# Patient Record
Sex: Male | Born: 1985
Health system: Southern US, Community
[De-identification: ages and names within clinical notes are randomized; demographics above are authoritative.]

## PROBLEM LIST (undated history)

## (undated) DIAGNOSIS — F419 Anxiety disorder, unspecified: Secondary | ICD-10-CM

## (undated) DIAGNOSIS — E139 Other specified diabetes mellitus without complications: Secondary | ICD-10-CM

## (undated) DIAGNOSIS — I10 Essential (primary) hypertension: Secondary | ICD-10-CM

## (undated) DIAGNOSIS — E669 Obesity, unspecified: Secondary | ICD-10-CM

## (undated) DIAGNOSIS — F329 Major depressive disorder, single episode, unspecified: Secondary | ICD-10-CM

## (undated) DIAGNOSIS — F32A Depression, unspecified: Secondary | ICD-10-CM

## (undated) HISTORY — PX: NO PAST SURGERIES: SHX2092

## (undated) HISTORY — DX: Anxiety disorder, unspecified: F41.9

## (undated) HISTORY — DX: Depression, unspecified: F32.A

---
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
## 2017-08-24 DIAGNOSIS — H5213 Myopia, bilateral: Secondary | ICD-10-CM | POA: Diagnosis not present

## 2017-11-25 DIAGNOSIS — Z131 Encounter for screening for diabetes mellitus: Secondary | ICD-10-CM | POA: Diagnosis not present

## 2017-11-25 DIAGNOSIS — Z6841 Body Mass Index (BMI) 40.0 and over, adult: Secondary | ICD-10-CM | POA: Diagnosis not present

## 2017-11-25 DIAGNOSIS — Z113 Encounter for screening for infections with a predominantly sexual mode of transmission: Secondary | ICD-10-CM | POA: Diagnosis not present

## 2017-11-25 DIAGNOSIS — L80 Vitiligo: Secondary | ICD-10-CM | POA: Diagnosis not present

## 2017-11-25 DIAGNOSIS — Z1331 Encounter for screening for depression: Secondary | ICD-10-CM | POA: Diagnosis not present

## 2017-11-25 DIAGNOSIS — Z0000 Encounter for general adult medical examination without abnormal findings: Secondary | ICD-10-CM | POA: Diagnosis not present

## 2018-04-29 DIAGNOSIS — F331 Major depressive disorder, recurrent, moderate: Secondary | ICD-10-CM | POA: Diagnosis not present

## 2018-05-12 DIAGNOSIS — F331 Major depressive disorder, recurrent, moderate: Secondary | ICD-10-CM | POA: Diagnosis not present

## 2018-05-26 DIAGNOSIS — F331 Major depressive disorder, recurrent, moderate: Secondary | ICD-10-CM | POA: Diagnosis not present

## 2018-06-10 DIAGNOSIS — F331 Major depressive disorder, recurrent, moderate: Secondary | ICD-10-CM | POA: Diagnosis not present

## 2018-06-14 ENCOUNTER — Telehealth (HOSPITAL_COMMUNITY): Payer: Self-pay | Admitting: Psychiatry

## 2018-06-14 ENCOUNTER — Encounter (HOSPITAL_COMMUNITY): Payer: Self-pay | Admitting: Psychiatry

## 2018-06-14 ENCOUNTER — Other Ambulatory Visit: Payer: Self-pay

## 2018-06-14 ENCOUNTER — Ambulatory Visit (INDEPENDENT_AMBULATORY_CARE_PROVIDER_SITE_OTHER): Payer: 59 | Admitting: Psychiatry

## 2018-06-14 DIAGNOSIS — F331 Major depressive disorder, recurrent, moderate: Secondary | ICD-10-CM | POA: Diagnosis not present

## 2018-06-14 DIAGNOSIS — F419 Anxiety disorder, unspecified: Secondary | ICD-10-CM

## 2018-06-14 MED ORDER — ESCITALOPRAM OXALATE 10 MG PO TABS: ORAL_TABLET | ORAL | 0 refills | Status: DC

## 2018-06-14 MED ORDER — HYDROXYZINE PAMOATE 25 MG PO CAPS: ORAL_CAPSULE | ORAL | 0 refills | Status: DC

## 2018-06-14 MED FILL — HYDROXYZINE PAMOATE 25 MG C: 25 | 15 days supply | Qty: 30 | Fill #0

## 2018-06-14 MED FILL — ESCITALOPRAM 10 MG TABLET: 10 | 30 days supply | Qty: 30 | Fill #0

## 2018-06-14 NOTE — Progress Notes (Signed)
Virtual Visit via Video Note  I connected with Jose Burgess on 06/14/18 at  1:00 PM EDT by a video enabled telemedicine application and verified that I am speaking with the correct person using two identifiers.   I discussed the limitations of evaluation and management by telemedicine and the availability of in person appointments. The patient expressed understanding and agreed to proceed.  [facility] Initial Assessment Note  DART RAULERSON [id number] 33 y.o.  06/14/2018 1:59 PM  Chief Complaint:  I have a lot of anxiety and depression.  History of Present Illness:  Jose Burgess is a 33 year old African-American, single, employed man who is referred from his counselor for medication management.  Patient struggle with anxiety and depression for the past few months.  He had a break-up 2 months ago after 2 years of relationship.  Patient admitted that he cheated with a coworker and his girlfriend find out by going through phone messages.  Even though he has no physical contact with the person that he cheated with but he is seeing her at his work.  He admitted feeling very guilty and regret about his act.  Patient told that he has been having a lot of anxiety, nervousness, racing thoughts, feeling down and sometimes having suicidal thoughts.  Though he denies any attempt but admitted last week he had a plan to purchase the weapon.  But he was able to distract himself and move on.  He does not have any weapon.  He started therapy few weeks ago but he does not feel it is helping as much.  Patient reported feeling isolated, withdrawn, lack of energy, having crying spells with lack of appetite.  He admitted his mood is irritable and ups and down and he worried about future.  Though he believes things are slowly getting better in his relationship but is not back to the normal.  He was moved out as before he was living together but now he moved in back but they are living in a separate room.  Patient  told that in the night this girl keeps coming into his sleep and he is not able to distract himself.  Patient reported anhedonia, feeling of worthlessness, excessive guilt, lack of energy and fatigue.  He also feel a lot of anxiety around people.  He reported that he had a panic attack while at work.  Patient also reported history of cheating in the past but did not provide details.  Patient denies any mania, psychosis, hallucination, aggression, violence or any nightmares.  He admitted increased drinking since the incident but denies any blackouts or intoxication.  His last drink was beer few days ago.  He denies any illegal substance use.  He works in the [employer] at [employer].  Patient told the girl also worked in the same department and he cannot avoid her.  He is thinking to switch his job location but he has not done so far.  Patient told that he enjoys playing video game and doing grill.  Patient currently not taking any psychotropic medication.   Suicidal Ideation: No Plan Formed: Last week thinking about purchasing of weapon. Patient has means to carry out plan: No  Homicidal Ideation: No Plan Formed: No Patient has means to carry out plan: No  Past Psychiatric History/Hospitalization(s): History of anxiety and depression in his early life.  History of being bullied in the school.  No history of taking any psychotropic medication.  No history of psychiatric inpatient treatment, suicidal attempt,  psychosis, paranoia or aggressive behavior.  Family History; Reported mother has anxiety.  Father has mental disorder but no detail.  Medical History; Patient has vitiligo and he is overweight.  Traumatic brain injury: No history of traumatic brain injury and seizures.  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  Psychosocial History; Patient born and raised in [place].  His father deceased  when he was only 33 year old.  Patient mother and 2 sisters lives in [place].  Legal History; Denies any legal issues.  History Of Abuse; History of bullied in the school.  Substance Abuse History; History of drinking recently more than usual but denies any withdrawals, intoxication or blackouts.  Review of Systems: Psychiatric: Agitation: No Hallucination: No Depressed Mood: Yes Insomnia: Yes Hypersomnia: No Altered Concentration: No Feels Worthless: Yes Grandiose Ideas: No Belief In Special Powers: No New/Increased Substance Abuse: Yes Compulsions: No  Neurologic: Headache: No Seizure: No Paresthesias: No   Outpatient Encounter Medications as of 06/14/2018  Medication Sig  . SILDENAFIL CITRATE PO CHEW 1 TO 2 TABLETS BY MOUTH 30 TO 45 MINUTES BEFORE SEXUAL INTERCOURSE   No facility-administered encounter medications on file as of 06/14/2018.     No results found for this or any previous visit (from the past 2160 hour(s)).    Constitutional:  There were no vitals taken for this visit.   Musculoskeletal: Strength & Muscle Tone: within normal limits Gait & Station: n/a Patient leans: N/A  Psychiatric Specialty Exam: Physical Exam  ROS  There were no vitals taken for this visit.There is no height or weight on file to calculate BMI.  General Appearance: Casual and have beared  Eye Contact:  wearing dark shades  Speech:  Slow  Volume:  Decreased  Mood:  Anxious and Depressed  Affect:  Congruent and Constricted  Thought Process:  Goal Directed  Orientation:  Full (Time, Place, and Person)  Thought Content:  Rumination  Suicidal Thoughts:  Yes.  without intent/plan  Homicidal Thoughts:  No  Memory:  Immediate;   Good Recent;   Good Remote;   Good  Judgement:  Good  Insight:  Good  Psychomotor Activity:  Normal  Concentration:  Concentration: Fair and Attention Span: Fair  Recall:  Good  Fund of Knowledge:  Good  Language:  Good  Akathisia:  No   Handed:  Right  AIMS (if indicated):     Assets:  Communication Skills Desire for Improvement Housing Physical Health Resilience Talents/Skills Transportation  ADL's:  Intact  Cognition:  WNL  Sleep:   poor      Assessment and plan: Jonny RuizJohn is a 33 year old African-American man presented with a symptoms of anxiety, depression.  He has never prescribed any psychotropic medication.  Currently he is seeing a therapist.  Discussed treatment option.  He like to try something to help his depression anxiety and sleep.  Recommend to try Lexapro 10 mg to help his depression anxiety.  Recommend to take half tablet for few days to tolerate better.  Explained that medicine can cause nausea, headaches and EPS.  I also recommend that he should try hydroxyzine 25 mg 1 to 2 capsule to help his anxiety and insomnia.  I also recommend intensive outpatient program.  Patient agreed with the plan.  I spoke to Jeri Modenaita Clark program coordinator and patient is going to visit with Jeri Modenaita Clark to get orientation about the program.  Discussed safety concern that anytime having active suicidal thoughts  or homicidal thought the need to call 911 or go to local emergency room.  Follow-up once he finished the program.  I recommend to call us back if he has any question or any concern.     Follow Up Instructions:    I discussed the assessment and treatment plan with the patient. The patient was provided an opportunity to ask questions and all were answered. The patient agreed with the plan and demonstrated an understanding of the instructions.   The patient was advised to call back or seek an in-person evaluation if the symptoms worsen or if the condition fails to improve as anticipated.  I provided 55 minutes of non-face-to-face time during this encounter.   Cleotis Nipper, MD

## 2018-06-14 NOTE — Telephone Encounter (Signed)
D:  Dr. Lolly Mustache referred pt to MH-IOP.  A:  Pt arrived for writer to orient him and provide him with a start date.  According to pt, he works from 9-4:30 pm everyday, but has a day off during the week and thought he could just attend group on that day.  Reiterated to pt that the groups are Monday thru Friday from 9-12n for two weeks.  Encouraged pt to check with HR and management to see if he would be able to attend MH-IOP.  Scheduled pt with Forde Radon, LPC on 06/22/18 @ 8 a.m; just in case if he's unable to do group.  Provided pt with writer's phone number.  Inform Dr. Lolly Mustache.  R:  Pt receptive.

## 2018-06-15 ENCOUNTER — Other Ambulatory Visit (HOSPITAL_COMMUNITY): Payer: 59

## 2018-06-16 ENCOUNTER — Other Ambulatory Visit (HOSPITAL_COMMUNITY): Payer: 59

## 2018-06-17 ENCOUNTER — Other Ambulatory Visit (HOSPITAL_COMMUNITY): Payer: 59

## 2018-06-21 ENCOUNTER — Other Ambulatory Visit (HOSPITAL_COMMUNITY): Payer: 59

## 2018-06-22 ENCOUNTER — Ambulatory Visit (INDEPENDENT_AMBULATORY_CARE_PROVIDER_SITE_OTHER): Payer: 59 | Admitting: Psychology

## 2018-06-22 ENCOUNTER — Other Ambulatory Visit: Payer: Self-pay

## 2018-06-22 ENCOUNTER — Other Ambulatory Visit (HOSPITAL_COMMUNITY): Payer: 59

## 2018-06-22 ENCOUNTER — Encounter (HOSPITAL_COMMUNITY): Payer: Self-pay | Admitting: Psychology

## 2018-06-22 DIAGNOSIS — F331 Major depressive disorder, recurrent, moderate: Secondary | ICD-10-CM

## 2018-06-22 DIAGNOSIS — F419 Anxiety disorder, unspecified: Secondary | ICD-10-CM

## 2018-06-22 NOTE — Progress Notes (Signed)
Virtual Visit via Video Note  I connected with Jose Burgess on 06/22/18 at  8:00 AM EDT by a video enabled telemedicine application and verified that I am speaking with the correct person using two identifiers.   I discussed the limitations of evaluation and management by telemedicine and the availability of in person appointments. The patient expressed understanding and agreed to proceed.  I provided 40 minutes of non-face-to-face time during this encounter.   Forde Radon [facility]  Comprehensive Clinical Assessment (CCA) Note  06/22/2018 Jose Burgess [id number]  Visit Diagnosis:      ICD-10-CM   1. MDD (major depressive disorder), recurrent episode, moderate (HCC) F33.1   2. Anxiety F41.9       CCA Part One  Part One has been completed on paper by the patient.  (See scanned document in Chart Review)  CCA Part Two A  Intake/Chief Complaint:  CCA Intake With Chief Complaint CCA Part Two Date: 06/22/18 CCA Part Two Time: 0812 Chief Complaint/Presenting Problem: pt is referred for counseling services by Dr. Lolly Mustache.  Pt has been dealing w/ depressive and anxiety symptoms that triggered by relationship problems that have occurred in the past couple of months.  pt reported that he started cheating January 2020 and this came to light in April to fiancee.  pt reported that they have been living together for 1.5 years and together for 2 years. pt reported that they are currently separated but he is living in the same house w/ her again.  pt reported that they are working on the relationship w/ the intention of getting back together and reports that things are heading in the right direction.  pt cheated w/ a coworker and so still sees when work the same shift.  pt reports this has made more difficult to move on and forward and still has difficulty getting the infidelity out of his head.  pt reports that this is the main stressor.  pt reports he had about 4 counseling sesisons w/ 'emily'  until discovered out of network.  pt reports that in 2008 he was dx w/ depression and anxiety but didn't take any meds at the time.   Patients Currently Reported Symptoms/Problems: pt reports that he is always worrying about something and general sense of unease.  pt reports that feels depressed moods some days- struggles w/ being very negative w/ self and putting self down daily.  pt reports he has been sleeping more and feeling very fatigued.  pt reports loss of concentration.  pt reports sometimes vague fleeting thoughts that tired of living like this, that life is going to get better.  Pt no intent for suicide or self harm.  pt no plans for self harm/ suicide and not hx.   Collateral Involvement: dr. Sheela Stack note Individual's Strengths: pt reports supports as sister and Danielle/girlfriend.  Pt enjoys cooking- grilling, smoking meats and watching tv and playing games. Individual's Preferences: "I want to be able to learn to deal w/ my anxiey and depression and to be happy" Type of Services Patient Feels Are Needed: counseling and medication management  Mental Health Symptoms Depression:  Depression: Fatigue, Change in energy/activity, Difficulty Concentrating, Hopelessness, Sleep (too much or little), Worthlessness, Increase/decrease in appetite  Mania:  Mania: N/A  Anxiety:   Anxiety: Difficulty concentrating, Fatigue, Sleep, Worrying  Psychosis:  Psychosis: N/A  Trauma:  Trauma: N/A  Obsessions:  Obsessions: N/A  Compulsions:  Compulsions: N/A  Inattention:  Inattention: N/A  Hyperactivity/Impulsivity:  Hyperactivity/Impulsivity: N/A  Oppositional/Defiant  Behaviors:  Oppositional/Defiant Behaviors: N/A  Borderline Personality:  Emotional Irregularity: N/A  Other Mood/Personality Symptoms:      Mental Status Exam Appearance and self-care  Stature:  Stature: Average  Weight:  Weight: Average weight  Clothing:     Grooming:  Grooming: Well-groomed  Cosmetic use:  Cosmetic Use: None   Posture/gait:  Posture/Gait: Normal  Motor activity:  Motor Activity: Not Remarkable  Sensorium  Attention:  Attention: Normal  Concentration:  Concentration: Normal  Orientation:  Orientation: X5  Recall/memory:  Recall/Memory: Normal  Affect and Mood  Affect:  Affect: Appropriate  Mood:  Mood: Depressed, Anxious  Relating  Eye contact:  Eye Contact: Normal  Facial expression:  Facial Expression: Responsive  Attitude toward examiner:  Attitude Toward Examiner: Cooperative  Thought and Language  Speech flow: Speech Flow: Normal  Thought content:  Thought Content: Appropriate to mood and circumstances  Preoccupation:     Hallucinations:     Organization:     Company secretaryxecutive Functions  Fund of Knowledge:  Fund of Knowledge: Average  Intelligence:  Intelligence: Average  Abstraction:  Abstraction: Normal  Judgement:  Judgement: Normal  Reality Testing:  Reality Testing: Adequate  Insight:  Insight: Good  Decision Making:  Decision Making: Normal  Social Functioning  Social Maturity:  Social Maturity: Responsible  Social Judgement:  Social Judgement: Normal  Stress  Stressors:  Stressors: Transitions(relationship)  Coping Ability:  Coping Ability: Building surveyorverwhelmed  Skill Deficits:     Supports:      Family and Psychosocial History: Family history Marital status: Single(pt in relationship for 2 years. living together- recently separated w/ intent to work out relationship) Are you sexually active?: Yes What is your sexual orientation?: straight Does patient have children?: No  Childhood History:  Childhood History By whom was/is the patient raised?: Both parents Description of patient's relationship with caregiver when they were a child: pt reports good family and close Patient's description of current relationship with people who raised him/her: pt reports close to family.  dad deceased when he was in 11th grade Does patient have siblings?: Yes Number of Siblings: 2 Description  of patient's current relationship with siblings: 2 older sisters who were grown and out of house when he was growing up.  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?: No Are There Guns or Other Weapons in Your Home?: No  Education: Education Last Grade Completed: 12 Did Garment/textile technologistYou Graduate From [employer]?: Yes Did Theme park managerYou Attend College?: (complete food service training program) Did You Have An Individualized Education Program (IIEP): Yes Did You Have Any Difficulty At School?: Yes(pt was bullied and teased.  pt had a learning disability and received pull out services) Were Any Medications Ever Prescribed For These Difficulties?: No  Religion:  Leisure/Recreation: Leisure / Recreation Leisure and Hobbies: smoking meats, grilling- watching tv and gaming  Exercise/Diet: Exercise/Diet Do You Exercise?: No Have You Gained or Lost A Significant Amount of Weight in the Past Six Months?: No Do You Follow a Special Diet?: No Do You Have Any Trouble Sleeping?: Yes Explanation of Sleeping Difficulties: feeling tired a lot and sleeping more  CCA Part Two C  Alcohol/Drug  Use: Alcohol / Drug Use History of alcohol / drug use?: No history of alcohol / drug abuse                      CCA Part Three  ASAM's:  Six Dimensions of Multidimensional Assessment  Dimension 1:  Acute Intoxication and/or Withdrawal Potential:     Dimension 2:  Biomedical Conditions and Complications:     Dimension 3:  Emotional, Behavioral, or Cognitive Conditions and Complications:     Dimension 4:  Readiness to Change:     Dimension 5:  Relapse, Continued use, or Continued Problem Potential:     Dimension 6:  Recovery/Living Environment:      Substance use Disorder (SUD)    Social Function:  Social Functioning Social Maturity: Responsible Social Judgement: Normal  Stress:  Stress Stressors: Transitions(relationship) Coping Ability: Overwhelmed Patient Takes Medications The Way The Doctor Instructed?: Yes Priority Risk: Low Acuity  Risk Assessment- Self-Harm Potential: Risk Assessment For Self-Harm Potential Thoughts of Self-Harm: No current thoughts Method: No plan Availability of Means: No access/NA  Risk Assessment -Dangerous to Others Potential: Risk Assessment For Dangerous to Others Potential Method: No Plan Availability of Means: No access or NA  DSM5 Diagnoses: There are no active problems to display for this patient.   Patient Centered Plan: Patient is on the following Treatment Plan(s):  Anxiety and Depression  Recommendations for Services/Supports/Treatments: Recommendations for Services/Supports/Treatments Recommendations For Services/Supports/Treatments: Individual Therapy, Medication Management  Treatment Plan Summary: OP Treatment Plan Summary: Pt to attend weekly counseling to assist coping w/ anxiety and depression.  Continue w/ Dr. Lolly Mustache for medication management.   I discussed the assessment and treatment plan with the patient. The patient was provided an opportunity to ask questions and all were answered. The patient agreed with  the plan and demonstrated an understanding of the instructions.   The patient was advised to call back or seek an in-person evaluation if the symptoms worsen or if the condition fails to improve as anticipated.  Forde Radon

## 2018-06-23 ENCOUNTER — Other Ambulatory Visit (HOSPITAL_COMMUNITY): Payer: 59

## 2018-06-24 ENCOUNTER — Other Ambulatory Visit (HOSPITAL_COMMUNITY): Payer: 59

## 2018-06-27 ENCOUNTER — Other Ambulatory Visit (HOSPITAL_COMMUNITY): Payer: 59

## 2018-06-28 ENCOUNTER — Other Ambulatory Visit (HOSPITAL_COMMUNITY): Payer: 59

## 2018-06-29 ENCOUNTER — Encounter (HOSPITAL_COMMUNITY): Payer: Self-pay | Admitting: Psychology

## 2018-06-29 ENCOUNTER — Other Ambulatory Visit: Payer: Self-pay

## 2018-06-29 ENCOUNTER — Other Ambulatory Visit (HOSPITAL_COMMUNITY): Payer: 59

## 2018-06-29 ENCOUNTER — Ambulatory Visit (HOSPITAL_COMMUNITY): Payer: 59 | Admitting: Psychology

## 2018-06-29 NOTE — Progress Notes (Signed)
Jose Burgess is a 33 y.o. male patient who called just prior to appointment and informed he couldn't make to appointment today.  Pt is scheduled for f/u.         Forde Radon, [facility]

## 2018-06-30 ENCOUNTER — Other Ambulatory Visit (HOSPITAL_COMMUNITY): Payer: 59

## 2018-07-01 ENCOUNTER — Other Ambulatory Visit (HOSPITAL_COMMUNITY): Payer: 59

## 2018-07-04 ENCOUNTER — Other Ambulatory Visit (HOSPITAL_COMMUNITY): Payer: 59

## 2018-07-05 ENCOUNTER — Other Ambulatory Visit (HOSPITAL_COMMUNITY): Payer: 59

## 2018-07-06 ENCOUNTER — Other Ambulatory Visit: Payer: Self-pay

## 2018-07-06 ENCOUNTER — Ambulatory Visit (INDEPENDENT_AMBULATORY_CARE_PROVIDER_SITE_OTHER): Payer: 59 | Admitting: Psychology

## 2018-07-06 DIAGNOSIS — F419 Anxiety disorder, unspecified: Secondary | ICD-10-CM | POA: Diagnosis not present

## 2018-07-06 DIAGNOSIS — F331 Major depressive disorder, recurrent, moderate: Secondary | ICD-10-CM

## 2018-07-06 NOTE — Progress Notes (Signed)
Virtual Visit via Video Note  I connected with Jose Burgess on 07/06/18 at  9:00 AM EDT by a video enabled telemedicine application and verified that I am speaking with the correct person using two identifiers.   I discussed the limitations of evaluation and management by telemedicine and the availability of in person appointments. The patient expressed understanding and agreed to proceed.    I discussed the assessment and treatment plan with the patient. The patient was provided an opportunity to ask questions and all were answered. The patient agreed with the plan and demonstrated an understanding of the instructions.   The patient was advised to call back or seek an in-person evaluation if the symptoms worsen or if the condition fails to improve as anticipated.  I provided 40 minutes of non-face-to-face time during this encounter.   Jan Fireman [facility]    THERAPIST PROGRESS NOTE  Session Time: 9.01-9.41am  Participation Level: Active  Behavioral Response: Well GroomedAlertaffect wnl  Type of Therapy: Individual Therapy  Treatment Goals addressed: Diagnosis: MDD, anxiety and goal 1.  Interventions: CBT, Supportive and Other: boundaries  Summary: Jose Burgess is a 33 y.o. male who presents with affect wnl.  Pt is ready for work, in his car just prior to going in. Pt reported that a lot has happened in the past 2 weeks and overall feels that is positive even though stressors and changes.  Pt reported that he and his ex had discussed that he needed to move out- that she was still really hurt and not sure could continue trying to work things out.  Pt reported hard but understood and aware that relationship wasn't going to be able to continue in way he wanted.  Pt reported that as not together he decided to go out w/ a new "friend"- but w/ romantic interests.  His ex found out as tracked his phone and showed up.  This was in the last week- and he moved out to stay w/ sister after  this.  Pt reported he understood that she was upset.  Pt reported that he is aware that he is looking for someone else to fill his void and doesn't want to be dependent on someone to feel happy.  Pt is aware that he needs to slowly develop the relationship then and still focus on self and enjoying things independently.  Pt discussed what this will look like then.  Pt reported he has apartment complex that lived in past that will f/u w/ for finding on place. His has returned to living in [place], [place] and will continue to work in [place].   Suicidal/Homicidal: Nowithout intent/plan  Therapist Response: Assessed pt current functioning per pt report. Processed w/pt change in relaitonship and concerns he has for being dependent on relationship for happiness. Discussed w/pt focus on his self care outside of relationship and how he can concretely do this.  Discussed transitions w/ the move.   Plan: Return again in 2 weeks, via webex.   Diagnosis: MDD, GAD  Jan Fireman [facility] 07/06/2018

## 2018-07-25 ENCOUNTER — Other Ambulatory Visit: Payer: Self-pay

## 2018-07-25 ENCOUNTER — Ambulatory Visit (HOSPITAL_COMMUNITY): Payer: 59 | Admitting: Psychology

## 2018-07-26 ENCOUNTER — Encounter (HOSPITAL_COMMUNITY): Payer: Self-pay | Admitting: Psychiatry

## 2018-07-26 ENCOUNTER — Ambulatory Visit (INDEPENDENT_AMBULATORY_CARE_PROVIDER_SITE_OTHER): Payer: 59 | Admitting: Psychiatry

## 2018-07-26 ENCOUNTER — Other Ambulatory Visit: Payer: Self-pay

## 2018-07-26 DIAGNOSIS — F419 Anxiety disorder, unspecified: Secondary | ICD-10-CM

## 2018-07-26 DIAGNOSIS — F331 Major depressive disorder, recurrent, moderate: Secondary | ICD-10-CM | POA: Diagnosis not present

## 2018-07-26 MED ORDER — ESCITALOPRAM OXALATE 10 MG PO TABS: ORAL_TABLET | ORAL | 1 refills | Status: DC

## 2018-07-26 MED ORDER — HYDROXYZINE HCL 10 MG PO TABS: ORAL_TABLET | ORAL | 1 refills | Status: DC

## 2018-07-26 MED FILL — ESCITALOPRAM 10 MG TABLET: 10 | 30 days supply | Qty: 30 | Fill #0

## 2018-07-26 MED FILL — hydrOXYzine HCL 10 MG TABS: 10 | 30 days supply | Qty: 60 | Fill #0

## 2018-07-26 NOTE — Progress Notes (Signed)
Virtual Visit via Telephone Note  I connected with Jose Burgess on 07/26/18 at  3:40 PM EDT by telephone and verified that I am speaking with the correct person using two identifiers.   I discussed the limitations, risks, security and privacy concerns of performing an evaluation and management service by telephone and the availability of in person appointments. I also discussed with the patient that there may be a patient responsible charge related to this service. The patient expressed understanding and agreed to proceed.   History of Present Illness: Patient was evaluated by phone session.  He is a 33 year old African-American man who was seen 4 weeks ago due to severe anxiety, depression.  He had a break-up after 2 years of relationship.  He had cheated with a coworker who also works in his department.  Patient works at [employer] at [employer].  He was feeling very guilty and regret about his act.  We started him on Lexapro and hydroxyzine and recommended IOP and individual therapy however due to his work schedule he could not do IOP.  He started therapy with Forde RadonLeanne Yates and doing well.  He also taking his medication which is helping his anxiety and depression.  He has no longer suicidal thoughts.  His energy level is good.  He is more hopeful and denies any crying spells or any feeling of worthlessness.  He has taken few times hydroxyzine to help his anxiety but he reported it is making him very groggy.  He like Lexapro since it is helping his depression and anxiety.  He endorses that he is still see the girl who works in his department but we tried to avoid eye contact and learn other ways to keep himself out of the communication.  Denies any panic attack.  Denies any hallucination, paranoia.  He admitted since taking the medication he is cut down his drinking and occasionally had a beer.  He is currently living with his sister in [place] until he find his own place.  Denies any illegal  substance use.  He wants to continue Lexapro and therapy with Forde RadonLeanne Yates.  He denies any agitation, anger or any mood swing.  He is wondering if he can try less dose of the hydroxyzine since 25 mg capsule is making him groggy.  His appetite is okay.  His energy level is good.  Past Psychiatric History: History of anxiety and depression in his early life.  History of being bullied in the school.  No history of taking any psychotropic medication.  No history of psychiatric inpatient treatment, suicidal attempt, psychosis, paranoia or aggressive behavior.    Psychiatric Specialty Exam: Physical Exam  ROS  There were no vitals taken for this visit.There is no height or weight on file to calculate BMI.  General Appearance: NA  Eye Contact:  NA  Speech:  Clear and Coherent and Normal Rate  Volume:  Normal  Mood:  Dysphoric  Affect:  NA  Thought Process:  Goal Directed  Orientation:  Full (Time, Place, and Person)  Thought Content:  Rumination  Suicidal Thoughts:  No  Homicidal Thoughts:  No  Memory:  Immediate;   Good Recent;   Good Remote;   Good  Judgement:  Good  Insight:  Good  Psychomotor Activity:  NA  Concentration:  Concentration: Fair and Attention Span: Fair  Recall:  Good  Fund of Knowledge:  Good  Language:  Good  Akathisia:  No  Handed:  Right  AIMS (if indicated):  Assets:  Communication Skills Desire for Improvement Housing Resilience Social Support  ADL's:  Intact  Cognition:  WNL  Sleep:   fair      Assessment and Plan: Major depressive disorder, recurrent.  Anxiety.  Patient doing better on Lexapro 10 mg.  He is tolerating very well and reported no tremors shakes or any EPS.  I will discontinue hydroxyzine capsule as patient is feeling groggy whenever he takes it.  Recommend to take hydroxyzine 10 mg tablet to take 1 to 2 tablet as needed if needed.  Encouraged to continue therapy with Jan Fireman.  Discussed medication side effects and benefits.   Recommended to call us back if is any question or any concern.  Follow-up in 2 months.  Follow Up Instructions:    I discussed the assessment and treatment plan with the patient. The patient was provided an opportunity to ask questions and all were answered. The patient agreed with the plan and demonstrated an understanding of the instructions.   The patient was advised to call back or seek an in-person evaluation if the symptoms worsen or if the condition fails to improve as anticipated.  I provided 30 minutes of non-face-to-face time during this encounter.   Kathlee Nations, MD

## 2018-09-13 MED FILL — ESCITALOPRAM 10 MG TABLET: 10 | 30 days supply | Qty: 30 | Fill #1

## 2018-09-13 MED FILL — hydrOXYzine HCL 10 MG TABS: 10 | 30 days supply | Qty: 60 | Fill #1

## 2018-09-26 ENCOUNTER — Encounter (HOSPITAL_COMMUNITY): Payer: Self-pay | Admitting: Psychiatry

## 2018-09-26 ENCOUNTER — Ambulatory Visit (INDEPENDENT_AMBULATORY_CARE_PROVIDER_SITE_OTHER): Payer: 59 | Admitting: Psychiatry

## 2018-09-26 ENCOUNTER — Other Ambulatory Visit: Payer: Self-pay

## 2018-09-26 DIAGNOSIS — F331 Major depressive disorder, recurrent, moderate: Secondary | ICD-10-CM

## 2018-09-26 DIAGNOSIS — F419 Anxiety disorder, unspecified: Secondary | ICD-10-CM | POA: Diagnosis not present

## 2018-09-26 MED ORDER — HYDROXYZINE HCL 10 MG PO TABS: ORAL_TABLET | ORAL | 1 refills | Status: DC

## 2018-09-26 MED ORDER — ESCITALOPRAM OXALATE 10 MG PO TABS: ORAL_TABLET | ORAL | 2 refills | Status: DC

## 2018-09-26 NOTE — Progress Notes (Signed)
Virtual Visit via Telephone Note  I connected with Jose Burgess on 09/26/18 at  4:00 PM EDT by telephone and verified that I am speaking with the correct person using two identifiers.   I discussed the limitations, risks, security and privacy concerns of performing an evaluation and management service by telephone and the availability of in person appointments. I also discussed with the patient that there may be a patient responsible charge related to this service. The patient expressed understanding and agreed to proceed.   History of Present Illness: Patient is evaluated by phone session.  On his last visit we cut down his hydroxyzine and he is only taking 10 mg tablet.  Patient told it is working very well.  He is sleeping good and he denies any feeling of grogginess.  His job is also going well.  He is still see the coworker who works in his department but there has been no recent contact or any communication.  Patient now moved into his own place in [place] and he has a new number.  He does not feel he need to see Jose Burgess since things are going well.  He denies any crying spells or any feeling of hopelessness or worthlessness.  His appetite is okay.  He denies any irritability, nervousness or any panic attack.  He denies drinking or using any illegal substances.  He has no tremors shakes or any EPS.  He wants to continue Lexapro and hydroxyzine as needed.  His appetite is okay.  His weight is a stable.   Past Psychiatric History: History of anxiety and depression in his early life. History of being bullied in the school. No history of taking any psychotropic medication. No history of psychiatric inpatient treatment, suicidal attempt, psychosis, paranoia or aggressive behavior.    Psychiatric Specialty Exam: Physical Exam  ROS  There were no vitals taken for this visit.There is no height or weight on file to calculate BMI.  General Appearance: NA  Eye Contact:  NA  Speech:   Clear and Coherent and Slow  Volume:  Normal  Mood:  Euthymic  Affect:  NA  Thought Process:  Goal Directed  Orientation:  Full (Time, Place, and Person)  Thought Content:  WDL and Logical  Suicidal Thoughts:  No  Homicidal Thoughts:  No  Memory:  Immediate;   Good Recent;   Good Remote;   Good  Judgement:  Good  Insight:  Good  Psychomotor Activity:  NA  Concentration:  Concentration: Good and Attention Span: Good  Recall:  Good  Fund of Knowledge:  Good  Language:  Good  Akathisia:  No  Handed:  Right  AIMS (if indicated):     Assets:  Communication Skills Desire for Improvement Housing Resilience Social Support Talents/Skills Transportation  ADL's:  Intact  Cognition:  WNL  Sleep:   improved      Assessment and Plan: Major depressive disorder, recurrent.  Anxiety.  Patient is doing much better on Lexapro and low-dose hydroxyzine.  He does not feel he need to see a therapist.  Discussed medication side effects and benefits.  Recommended to call us back if he has any question or any concern.  Follow-up in 3 months.  Follow Up Instructions:    I discussed the assessment and treatment plan with the patient. The patient was provided an opportunity to ask questions and all were answered. The patient agreed with the plan and demonstrated an understanding of the instructions.   The patient was advised  to call back or seek an in-person evaluation if the symptoms worsen or if the condition fails to improve as anticipated.  I provided 15 minutes of non-face-to-face time during this encounter.   Kathlee Nations, MD

## 2018-10-20 ENCOUNTER — Encounter (HOSPITAL_COMMUNITY): Payer: Self-pay | Admitting: Psychology

## 2018-10-20 NOTE — Progress Notes (Signed)
Jose Burgess is a 33 y.o. male patient who is discharged from counseling as no longer seeking counseling at this time.  Outpatient Therapist Discharge Summary  [name]    [birth year]   Admission Date: 06/22/18    Discharge Date:  10/20/18 Reason for Discharge:  Not seeking counseling  Diagnosis:  MDD, Anxiety  Comments:  Pt no show for appt in June 2020, didn't schedule further f/u.  Per psychiatrist note on 09/26/18 he is doing well and not seeking further counseling at this time.  F/u as scheduled w/ Dr. Adele Schilder.  [name], [facility]

## 2018-12-20 ENCOUNTER — Encounter (HOSPITAL_COMMUNITY): Payer: Self-pay | Admitting: Psychiatry

## 2018-12-20 ENCOUNTER — Other Ambulatory Visit: Payer: Self-pay

## 2018-12-20 ENCOUNTER — Ambulatory Visit (INDEPENDENT_AMBULATORY_CARE_PROVIDER_SITE_OTHER): Payer: 59 | Admitting: Psychiatry

## 2018-12-20 DIAGNOSIS — F331 Major depressive disorder, recurrent, moderate: Secondary | ICD-10-CM | POA: Diagnosis not present

## 2018-12-20 DIAGNOSIS — F419 Anxiety disorder, unspecified: Secondary | ICD-10-CM | POA: Diagnosis not present

## 2018-12-20 MED ORDER — ESCITALOPRAM OXALATE 10 MG PO TABS: ORAL_TABLET | ORAL | 2 refills | Status: DC

## 2018-12-20 MED ORDER — HYDROXYZINE HCL 10 MG PO TABS: ORAL_TABLET | ORAL | 1 refills | Status: DC

## 2018-12-20 NOTE — Progress Notes (Signed)
Virtual Visit via Telephone Note  I connected with Desiree Lucy Mealing on 12/20/18 at  4:20 PM EST by telephone and verified that I am speaking with the correct person using two identifiers.   I discussed the limitations, risks, security and privacy concerns of performing an evaluation and management service by telephone and the availability of in person appointments. I also discussed with the patient that there may be a patient responsible charge related to this service. The patient expressed understanding and agreed to proceed.   History of Present Illness: Patient was evaluated by phone session.  He is no longer taking Lexapro because he felt it was causing GI side effects.  He stopped 2 weeks ago but notices depression is getting worse.  He is sleeping good with hydroxyzine.  His job is also going well and he has not seen his coworker and his department which was the issue before.  He has no tremors, shakes or any EPS.  He feels his anxiety is not as bad and he does not need to see therapist.  He denies any crying spells or any feeling of hopelessness or worthlessness.  Energy level is okay.  His appetite is okay and his weight is unchanged from the past.  He weighs 290 pounds.  Patient works in [employer].  Patient is now moved into his own place and is happy about it.   Past Psychiatric History: History of anxiety and depression in his early life. History of being bullied in the school. No history of taking any psychotropic medication. No history of psychiatric inpatient treatment, suicidal attempt, psychosis, paranoia or aggressive behavior.    Psychiatric Specialty Exam: Physical Exam  ROS  There were no vitals taken for this visit.There is no height or weight on file to calculate BMI.  General Appearance: NA  Eye Contact:  NA  Speech:  Clear and Coherent and Normal Rate  Volume:  Normal  Mood:  Anxious and Dysphoric  Affect:  NA  Thought Process:  Goal Directed   Orientation:  Full (Time, Place, and Person)  Thought Content:  Logical  Suicidal Thoughts:  No  Homicidal Thoughts:  No  Memory:  Immediate;   Good Recent;   Good Remote;   Good  Judgement:  Fair  Insight:  Good  Psychomotor Activity:  NA  Concentration:  Concentration: Good and Attention Span: Good  Recall:  Good  Fund of Knowledge:  Good  Language:  Good  Akathisia:  No  Handed:  Right  AIMS (if indicated):     Assets:  Communication Skills Desire for Improvement Housing Resilience Talents/Skills Transportation  ADL's:  Intact  Cognition:  WNL  Sleep:   ok      Assessment and Plan: Major depressive disorder, recurrent.  Anxiety.  I recommend that he should try Lexapro taking at bedtime since it was helping his depression and since he stopped he noticed his depression is coming back.  He agreed with the plan.  Continue low-dose hydroxyzine to help his insomnia and anxiety.  Continue present dose 10 mg hydroxyzine.  Recommended to call us back if is any question or any concern.  Follow-up in 3 months.  Follow Up Instructions:    I discussed the assessment and treatment plan with the patient. The patient was provided an opportunity to ask questions and all were answered. The patient agreed with the plan and demonstrated an understanding of the instructions.   The patient was advised to call back or seek an  in-person evaluation if the symptoms worsen or if the condition fails to improve as anticipated.  I provided 20 minutes of non-face-to-face time during this encounter.   Kathlee Nations, MD

## 2018-12-26 ENCOUNTER — Ambulatory Visit (HOSPITAL_COMMUNITY): Payer: 59 | Admitting: Psychiatry

## 2019-03-21 ENCOUNTER — Ambulatory Visit (INDEPENDENT_AMBULATORY_CARE_PROVIDER_SITE_OTHER): Payer: 59 | Admitting: Psychiatry

## 2019-03-21 ENCOUNTER — Encounter (HOSPITAL_COMMUNITY): Payer: Self-pay | Admitting: Psychiatry

## 2019-03-21 ENCOUNTER — Other Ambulatory Visit: Payer: Self-pay

## 2019-03-21 DIAGNOSIS — F419 Anxiety disorder, unspecified: Secondary | ICD-10-CM

## 2019-03-21 DIAGNOSIS — F331 Major depressive disorder, recurrent, moderate: Secondary | ICD-10-CM

## 2019-03-21 MED ORDER — HYDROXYZINE HCL 10 MG PO TABS: ORAL_TABLET | ORAL | 1 refills | Status: DC

## 2019-03-21 MED ORDER — ESCITALOPRAM OXALATE 10 MG PO TABS: ORAL_TABLET | ORAL | 2 refills | Status: DC

## 2019-03-21 MED FILL — hydrOXYzine HCL 10 MG TABS: 10 | 30 days supply | Qty: 30 | Fill #0

## 2019-03-21 MED FILL — ESCITALOPRAM 10 MG TABLET: 10 | 30 days supply | Qty: 30 | Fill #0

## 2019-03-21 NOTE — Progress Notes (Signed)
Virtual Visit via Telephone Note  I connected with Humberto Leep Flatt on 03/21/19 at  4:20 PM EST by telephone and verified that I am speaking with the correct person using two identifiers.   I discussed the limitations, risks, security and privacy concerns of performing an evaluation and management service by telephone and the availability of in person appointments. I also discussed with the patient that there may be a patient responsible charge related to this service. The patient expressed understanding and agreed to proceed.   History of Present Illness: Patient was evaluated by phone session.  He is taking Lexapro and hydroxyzine.  Since he back on Lexapro his anxiety depression is much better.  He is sleeping good.  His job is also going well and he has no more issues with the coworker and has not seen the coworker since the last visit.  He had a good holidays.  Denies any crying spells or any hopelessness.  His energy level is good.  His appetite is okay.  He works in [employer].  He denies drinking or using any illegal substances.  He has no tremors, shakes or any EPS.   Past Psychiatric History: H/O anxiety and depression. H/O being bullied in the school. No h/o prior meds, inpatient treatment, suicidal attempt, psychosis, paranoia or aggressive behavior.    Psychiatric Specialty Exam: Physical Exam  Review of Systems  There were no vitals taken for this visit.There is no height or weight on file to calculate BMI.  General Appearance: NA  Eye Contact:  NA  Speech:  Clear and Coherent and Normal Rate  Volume:  Normal  Mood:  Euthymic  Affect:  NA  Thought Process:  Goal Directed  Orientation:  Full (Time, Place, and Person)  Thought Content:  WDL and Logical  Suicidal Thoughts:  No  Homicidal Thoughts:  No  Memory:  Immediate;   Good Recent;   Good Remote;   Good  Judgement:  Intact  Insight:  Present  Psychomotor Activity:  NA  Concentration:  Concentration: Good  and Attention Span: Good  Recall:  Good  Fund of Knowledge:  Good  Language:  Good  Akathisia:  No  Handed:  Right  AIMS (if indicated):     Assets:  Communication Skills Desire for Improvement Housing Resilience Social Support Talents/Skills Transportation  ADL's:  Intact  Cognition:  WNL  Sleep:   ok      Assessment and Plan: Major depressive disorder, recurrent.  Anxiety.  Patient doing very well since he is back on Lexapro.  He does not want to change medication since he feels both medicine is working very well.  Continue Lexapro 10 mg daily and hydroxyzine 10 mg as needed for anxiety.  Recommended to call us back if is any question or any concern.  Follow-up in 3 months.  Follow Up Instructions:    I discussed the assessment and treatment plan with the patient. The patient was provided an opportunity to ask questions and all were answered. The patient agreed with the plan and demonstrated an understanding of the instructions.   The patient was advised to call back or seek an in-person evaluation if the symptoms worsen or if the condition fails to improve as anticipated.  I provided 15 minutes of non-face-to-face time during this encounter.   Cleotis Nipper, MD

## 2019-05-12 ENCOUNTER — Ambulatory Visit: Payer: Self-pay | Attending: Internal Medicine

## 2019-05-12 DIAGNOSIS — Z23 Encounter for immunization: Secondary | ICD-10-CM

## 2019-05-12 NOTE — Progress Notes (Signed)
   Covid-19 Vaccination Clinic  Name:  Jose Burgess    MRN: [id number] DOB: 04/19/1985  05/12/2019  [name] was observed post Covid-19 immunization for 15 minutes without incident. He was provided with Vaccine Information Sheet and instruction to access the V-Safe system.   [name] was instructed to call 911 with any severe reactions post vaccine: Marland Kitchen Difficulty breathing  . Swelling of face and throat  . A fast heartbeat  . A bad rash all over body  . Dizziness and weakness   Immunizations Administered    Name Date Dose VIS Date Route   Pfizer COVID-19 Vaccine 05/12/2019  9:31 AM 0.3 mL 01/06/2019 Intramuscular   Manufacturer: [employer], [employer]   Lot: [id number]   NDC: [address]-3

## 2019-06-05 ENCOUNTER — Ambulatory Visit: Payer: Self-pay

## 2019-06-08 ENCOUNTER — Encounter (HOSPITAL_COMMUNITY): Payer: Self-pay | Admitting: Emergency Medicine

## 2019-06-08 ENCOUNTER — Emergency Department (HOSPITAL_COMMUNITY)
Admission: EM | Admit: 2019-06-08 | Discharge: 2019-06-08 | Disposition: A | Discharge status: Home / Self Care | Payer: Self-pay | Attending: Emergency Medicine | Admitting: Emergency Medicine

## 2019-06-08 ENCOUNTER — Emergency Department (HOSPITAL_COMMUNITY): Payer: Self-pay

## 2019-06-08 ENCOUNTER — Other Ambulatory Visit: Payer: Self-pay

## 2019-06-08 DIAGNOSIS — J189 Pneumonia, unspecified organism: Secondary | ICD-10-CM | POA: Insufficient documentation

## 2019-06-08 DIAGNOSIS — R05 Cough: Secondary | ICD-10-CM | POA: Insufficient documentation

## 2019-06-08 LAB — CBC
HCT: 42.5 % (ref 39.0–52.0)
Hemoglobin: 13.8 g/dL (ref 13.0–17.0)
MCH: 31.5 pg (ref 26.0–34.0)
MCHC: 32.5 g/dL (ref 30.0–36.0)
MCV: 97 fL (ref 80.0–100.0)
Platelets: 329 K/uL (ref 150–400)
RBC: 4.38 MIL/uL (ref 4.22–5.81)
RDW: 11.8 % (ref 11.5–15.5)
WBC: 13.8 K/uL — ABNORMAL HIGH (ref 4.0–10.5)
nRBC: 0 % (ref 0.0–0.2)

## 2019-06-08 LAB — BASIC METABOLIC PANEL
Anion gap: 9 (ref 5–15)
BUN: 16 mg/dL (ref 6–20)
CO2: 27 mmol/L (ref 22–32)
Calcium: 9.8 mg/dL (ref 8.9–10.3)
Chloride: 105 mmol/L (ref 98–111)
Creatinine, Ser: 1.03 mg/dL (ref 0.61–1.24)
GFR calc Af Amer: >60 mL/min (ref >60)
GFR calc non Af Amer: >60 mL/min (ref >60)
Glucose, Bld: 121 mg/dL — ABNORMAL HIGH (ref 70–99)
Potassium: 3.6 mmol/L (ref 3.5–5.1)
Sodium: 141 mmol/L (ref 135–145)

## 2019-06-08 LAB — TROPONIN I (HIGH SENSITIVITY)
Troponin I (High Sensitivity): 2 ng/L (ref <18)
Troponin I (High Sensitivity): 2 ng/L

## 2019-06-08 MED ORDER — DOXYCYCLINE HYCLATE 100 MG PO TABS
100.0000 mg | ORAL_TABLET | Freq: Once | ORAL | Status: AC
  Administered 2019-06-08: 100 mg via ORAL
  Filled 2019-06-08: qty 1

## 2019-06-08 MED ORDER — SODIUM CHLORIDE 0.9% FLUSH
3.0000 mL | Freq: Once | INTRAVENOUS | Status: AC
  Administered 2019-06-08: 3 mL via INTRAVENOUS

## 2019-06-08 MED ORDER — DOXYCYCLINE HYCLATE 100 MG PO CAPS
100.0000 mg | ORAL_CAPSULE | Freq: Two times a day (BID) | ORAL | 0 refills | Status: DC
Start: 2019-06-08 — End: 2020-04-01

## 2019-06-08 NOTE — ED Provider Notes (Signed)
[facility] DEPT Provider Note   CSN: [id number] Arrival date & time: 06/08/19  1714     History Chief Complaint  Patient presents with  . Nasal Congestion  . Chest Pain    Jose Burgess is a 34 y.o. male.  HPI He presents for evaluation of right-sided chest pain.  Radiates to his right arm.  The pain is dull and intermittent.  He has also had a cough for 1 week which is occasionally productive of yellow phlegm.  He denies fever, chills, nausea, vomiting or weakness or dizziness.  He works several different jobs, mostly delivery.  He does not smoke tobacco products but does smoke CBD occasionally.  There are no other known modifying factors.    Past Medical History:  Diagnosis Date  . Anxiety   . Depression     Patient Active Problem List   Diagnosis Date Noted  . Class 3 severe obesity due to excess calories with body mass index (BMI) of 40.0 to 44.9 in adult ([place]) 11/25/2017  . Vitiligo 11/25/2017    History reviewed. No pertinent surgical history.     Family History  Problem Relation Age of Onset  . Anxiety disorder Mother   . Heart disease Father     Social History   Tobacco Use  . Smoking status: Unknown If Ever Smoked  . Smokeless tobacco: Never Used  Substance Use Topics  . Alcohol use: Yes  . Drug use: Not Currently    Home Medications Prior to Admission medications   Medication Sig Start Date End Date Taking? Authorizing Provider  doxycycline (VIBRAMYCIN) 100 MG capsule Take 1 capsule (100 mg total) by mouth 2 (two) times daily. One po bid x 7 days 06/08/19   Daleen Bo, MD  escitalopram (LEXAPRO) 10 MG tablet Take one tab daily 03/21/19   Arfeen, Arlyce Harman, MD  hydrOXYzine (ATARAX/VISTARIL) 10 MG tablet Take one tab as needed for anxiety 03/21/19   Arfeen, Arlyce Harman, MD  SILDENAFIL CITRATE PO CHEW 1 TO 2 TABLETS BY MOUTH 30 TO 45 MINUTES BEFORE SEXUAL INTERCOURSE 02/24/18   [provider]    Allergies     Patient has no known allergies.  Review of Systems   Review of Systems  All other systems reviewed and are negative.   Physical Exam Updated Vital Signs BP (!) 153/95 (BP Location: Left Arm)   Pulse (!) 105   Temp 99.6 F (37.6 C) (Oral)   Resp 18   SpO2 96%   Physical Exam Vitals and nursing note reviewed.  Constitutional:      General: He is not in acute distress.    Appearance: He is well-developed. He is obese. He is not ill-appearing, toxic-appearing or diaphoretic.  HENT:     Head: Normocephalic and atraumatic.     Right Ear: External ear normal.     Left Ear: External ear normal.  Eyes:     Conjunctiva/sclera: Conjunctivae normal.     Pupils: Pupils are equal, round, and reactive to light.  Neck:     Trachea: Phonation normal.  Cardiovascular:     Rate and Rhythm: Normal rate and regular rhythm.     Heart sounds: Normal heart sounds.  Pulmonary:     Effort: Pulmonary effort is normal. No respiratory distress.     Breath sounds: Normal breath sounds. No stridor. No wheezing or rhonchi.  Chest:     Chest wall: No tenderness.  Abdominal:     General: There is  no distension.     Palpations: Abdomen is soft.     Tenderness: There is no abdominal tenderness.  Musculoskeletal:        General: Normal range of motion.     Cervical back: Normal range of motion and neck supple.  Skin:    General: Skin is warm and dry.  Neurological:     Mental Status: He is alert and oriented to person, place, and time.     Cranial Nerves: No cranial nerve deficit.     Sensory: No sensory deficit.     Motor: No abnormal muscle tone.     Coordination: Coordination normal.  Psychiatric:        Mood and Affect: Mood normal.        Behavior: Behavior normal.        Thought Content: Thought content normal.        Judgment: Judgment normal.     ED Results / Procedures / Treatments   Labs (all labs ordered are listed, but only abnormal results are displayed) Labs Reviewed  BASIC  METABOLIC PANEL - Abnormal; Notable for the following components:      Result Value   Glucose, Bld 121 (*)    All other components within normal limits  CBC - Abnormal; Notable for the following components:   WBC 13.8 (*)    All other components within normal limits  TROPONIN I (HIGH SENSITIVITY)  TROPONIN I (HIGH SENSITIVITY)    EKG EKG Interpretation  Date/Time:  Thursday Jun 08 2019 17:26:22 EDT Ventricular Rate:  114 PR Interval:    QRS Duration: 101 QT Interval:  322 QTC Calculation: 444 R Axis:   58 Text Interpretation: Sinus tachycardia Borderline T wave abnormalities No old tracing to compare Confirmed by Mancel Bale [phone]) on 06/08/2019 10:17:44 PM   Radiology DG Chest 2 View  Result Date: 06/08/2019 CLINICAL DATA:  Chest pain, cough. EXAM: CHEST - 2 VIEW COMPARISON:  None. FINDINGS: The heart size and mediastinal contours are within normal limits. No pneumothorax or pleural effusion is noted. Left lung is clear. Right upper lobe airspace opacity is noted most consistent with pneumonia. The visualized skeletal structures are unremarkable. IMPRESSION: Right upper lobe pneumonia. Electronically Signed   By: Lupita Raider M.D.   On: 06/08/2019 18:05    Procedures Procedures (including critical care time)  Medications Ordered in ED Medications  doxycycline (VIBRA-TABS) tablet 100 mg (has no administration in time range)  sodium chloride flush (NS) 0.9 % injection 3 mL (3 mLs Intravenous Given 06/08/19 2215)    ED Course  I have reviewed the triage vital signs and the nursing notes.  Pertinent labs & imaging results that were available during my care of the patient were reviewed by me and considered in my medical decision making (see chart for details).  Clinical Course as of Jun 08 2226  Thu Jun 08, 2019  2217 Per radiologist, right upper lobe pneumonia  DG Chest 2 View [EW]  2218 Normal except white count high  CBC(!) [EW]  2218 Normal  Troponin I (High  Sensitivity) [EW]  2219 Normal except glucose high  Basic metabolic panel(!) [EW]    Clinical Course User Index [EW] Mancel Bale, MD   MDM Rules/Calculators/A&P                       Patient Vitals for the past 24 hrs:  BP Temp Temp src Pulse Resp SpO2  06/08/19 2144 (!) 153/95 99.6  F (37.6 C) Oral (!) 105 18 96 %    10:27 PM Reevaluation with update and discussion. After initial assessment and treatment, an updated evaluation reveals he remains comfortable has no additional complaints.  Findings discussed with patient all questions were answered. Mancel Bale   Medical Decision Making:  This patient is presenting for evaluation of chest pain and cough, which does require a range of treatment options, and is a complaint that involves a moderate risk of morbidity and mortality. The differential diagnoses include bronchitis, pneumonia, viral infection. I decided to review old records, and in summary relatively healthy obese man who is not a current tobacco smoker.  I did not require additional historical information from anyone.  Clinical Laboratory Tests Ordered, included CBC, Metabolic panel and Troponin testing. Review indicates mild white count elevation, otherwise reassuring. Radiologic Tests Ordered, included chest x-ray.  I independently Visualized: Radiographic images, which show right upper lobe pneumonia  Cardiac Monitor Tracing which shows normal sinus rhythm    Critical Interventions-clinical evaluation, laboratory testing, imaging, treatment with antibiotic and reassessment.  After These Interventions, the Patient was reevaluated and was found stable for discharge.  He has community-acquired pneumonia.  Is not had chronic ongoing respiratory issues.  He is stable for discharge.  CRITICAL CARE-no Performed by: Mancel Bale  Nursing Notes Reviewed/ Care Coordinated Applicable Imaging Reviewed Interpretation of Laboratory Data incorporated into ED treatment   The patient appears reasonably screened and/or stabilized for discharge and I doubt any other medical condition or other [facility] requiring further screening, evaluation, or treatment in the ED at this time prior to discharge.  Plan: Home Medications-continue routine medications; Home Treatments-rest, fluids; return here if the recommended treatment, does not improve the symptoms; Recommended follow up-PCP, as needed     Final Clinical Impression(s) / ED Diagnoses Final diagnoses:  Community acquired pneumonia of right upper lobe of lung    Rx / DC Orders ED Discharge Orders         Ordered    doxycycline (VIBRAMYCIN) 100 MG capsule  2 times daily     06/08/19 2228           Mancel Bale, MD 06/08/19 2229

## 2019-06-08 NOTE — ED Triage Notes (Signed)
Pt c/o right sided chest pains that radiates to right arm that got worse an hour ago. Also having congestion for few days.

## 2019-06-08 NOTE — Discharge Instructions (Signed)
It appears that your discomfort is caused by pneumonia.  This should get better with the antibiotic which has been prescribed.  To help improve your status, get plenty of rest and drink a lot of fluids.  Avoid overexertion for now.  See the doctor of your choice if not better in 3 or 4 days.

## 2019-06-15 ENCOUNTER — Ambulatory Visit: Payer: Self-pay | Attending: Internal Medicine

## 2019-06-15 DIAGNOSIS — Z23 Encounter for immunization: Secondary | ICD-10-CM

## 2019-06-15 NOTE — Progress Notes (Signed)
   Covid-19 Vaccination Clinic  Name:  Jose Burgess    MRN: [id number] DOB: 1985/09/14  06/15/2019  [name] was observed post Covid-19 immunization for 15 minutes without incident. He was provided with Vaccine Information Sheet and instruction to access the V-Safe system.   [name] was instructed to call 911 with any severe reactions post vaccine: Marland Kitchen Difficulty breathing  . Swelling of face and throat  . A fast heartbeat  . A bad rash all over body  . Dizziness and weakness   Immunizations Administered    Name Date Dose VIS Date Route   Pfizer COVID-19 Vaccine 06/15/2019  8:55 AM 0.3 mL 03/22/2018 Intramuscular   Manufacturer: [employer], [employer]   Lot: [id number]   NDC: [address]-5

## 2019-06-20 ENCOUNTER — Ambulatory Visit (HOSPITAL_COMMUNITY): Payer: Self-pay | Admitting: Psychiatry

## 2019-08-18 ENCOUNTER — Ambulatory Visit: Payer: Self-pay | Admitting: Nurse Practitioner

## 2019-12-05 ENCOUNTER — Other Ambulatory Visit: Payer: Self-pay

## 2019-12-05 ENCOUNTER — Ambulatory Visit: Payer: 59 | Attending: Nurse Practitioner | Admitting: Nurse Practitioner

## 2019-12-05 ENCOUNTER — Encounter: Payer: Self-pay | Admitting: Nurse Practitioner

## 2019-12-05 VITALS — Ht 70.0 in | Wt 300.0 lb

## 2019-12-05 DIAGNOSIS — Z130 Encounter for screening for diseases of the blood and blood-forming organs and certain disorders involving the immune mechanism: Secondary | ICD-10-CM

## 2019-12-05 DIAGNOSIS — Z833 Family history of diabetes mellitus: Secondary | ICD-10-CM | POA: Diagnosis not present

## 2019-12-05 DIAGNOSIS — Z7689 Persons encountering health services in other specified circumstances: Secondary | ICD-10-CM

## 2019-12-05 NOTE — Progress Notes (Signed)
Virtual Visit via Telephone Note Due to national recommendations of social distancing due to [place], telehealth visit is felt to be most appropriate for this patient at this time.  I discussed the limitations, risks, security and privacy concerns of performing an evaluation and management service by telephone and the availability of in person appointments. I also discussed with the patient that there may be a patient responsible charge related to this service. The patient expressed understanding and agreed to proceed.    I connected with Desiree Lucy Delamar on 12/05/19  at   1:50 PM EST  EDT by telephone and verified that I am speaking with the correct person using two identifiers.   Consent I discussed the limitations, risks, security and privacy concerns of performing an evaluation and management service by telephone and the availability of in person appointments. I also discussed with the patient that there may be a patient responsible charge related to this service. The patient expressed understanding and agreed to proceed.   Location of Patient: Private Residence   Location of Provider: [facility] [employer] Office    Persons participating in Telemedicine visit: Geryl Rankins FNP-BC [place]    History of Present Illness: Telemedicine visit for: Establish Care  He has a strong family history of diabetes and HTN in his mother and father. Denies ever being diagnosed with either conditions.   Anxiety and Depression He has stopped taking lexapro and hydroxyzine. States he feels pretty good. Was seeing Dr. Adele Schilder in the past but no longer sees. Declines restarting either medication today.  Depression screen [facility] 2/9 12/05/2019  Decreased Interest 0  Down, Depressed, Hopeless 0  PHQ - 2 Score 0  Altered sleeping 0  Tired, decreased energy 0  Change in appetite 0  Feeling bad or failure about yourself  0  Trouble concentrating 0  Moving slowly or  fidgety/restless 0  Suicidal thoughts 0  PHQ-9 Score 0  Some encounter information is confidential and restricted. Go to Review Flowsheets activity to see all data.   GAD 7 : Generalized Anxiety Score 12/05/2019  Nervous, Anxious, on Edge 0  Control/stop worrying 2  Worry too much - different things 2  Trouble relaxing 0  Restless 0  Easily annoyed or irritable 0  Afraid - awful might happen 0  Total GAD 7 Score 4  Some encounter information is confidential and restricted. Go to Review Flowsheets activity to see all data.       Past Medical History:  Diagnosis Date  . Anxiety   . Depression     Past Surgical History:  Procedure Laterality Date  . NO PAST SURGERIES      Family History  Problem Relation Age of Onset  . Anxiety disorder Mother   . Hyperlipidemia Mother   . Diabetes Mother   . Heart disease Father   . Hyperlipidemia Father   . Diabetes Father     Social History   Socioeconomic History  . Marital status: Single    Spouse name: Not on file  . Number of children: Not on file  . Years of education: Not on file  . Highest education level: Not on file  Occupational History  . Not on file  Tobacco Use  . Smoking status: Never Smoker  . Smokeless tobacco: Never Used  Substance and Sexual Activity  . Alcohol use: Yes    Comment: Occasionally   . Drug use: Not Currently  . Sexual activity: Yes  Other Topics Concern  . Not on file  Social History Narrative  . Not on file   Social Determinants of Health   Financial Resource Strain:   . Difficulty of Paying Living Expenses: Not on file  Food Insecurity:   . Worried About Running Out of Food in the Last Year: Not on file  . Ran Out of Food in the Last Year: Not on file  Transportation Needs:   . Lack of Transportation (Medical): Not on file  . Lack of Transportation (Non-Medical): Not on file  Physical Activity:   . Days of Exercise per Week: Not on file  . Minutes of Exercise per Session: Not on  file  Stress:   . Feeling of Stress : Not on file  Social Connections:   . Frequency of Communication with Friends and Family: Not on file  . Frequency of Social Gatherings with Friends and Family: Not on file  . Attends Religious Services: Not on file  . Active Member of Clubs or Organizations: Not on file  . Attends Club or Organization Meetings: Not on file  . Marital Status: Not on file     Observations/Objective: Awake, alert and oriented x 3   Review of Systems  Constitutional: Negative for fever, malaise/fatigue and weight loss.  HENT: Negative.  Negative for nosebleeds.   Eyes: Negative.  Negative for blurred vision, double vision and photophobia.  Respiratory: Negative.  Negative for cough and shortness of breath.   Cardiovascular: Negative.  Negative for chest pain, palpitations and leg swelling.  Gastrointestinal: Negative.  Negative for heartburn, nausea and vomiting.  Musculoskeletal: Negative.  Negative for myalgias.  Neurological: Negative.  Negative for dizziness, focal weakness, seizures and headaches.  Psychiatric/Behavioral: Negative.  Negative for suicidal ideas.    Assessment and Plan: Diagnoses and all orders for this visit:  Encounter to establish care  Family history of diabetes mellitus in mother -     Hemoglobin A1c; Future -     CMP14+EGFR; Future  Obesity, morbid, BMI 40.0-49.9 (HCC) -     CMP14+EGFR; Future -     Lipid panel; Future -     TSH; Future  Screening for deficiency anemia -     CBC; Future     Follow Up Instructions Return in about 4 weeks (around 01/02/2020) for BP recheck.     I discussed the assessment and treatment plan with the patient. The patient was provided an opportunity to ask questions and all were answered. The patient agreed with the plan and demonstrated an understanding of the instructions.   The patient was advised to call back or seek an in-person evaluation if the symptoms worsen or if the condition fails to  improve as anticipated.  I provided 15 minutes of non-face-to-face time during this encounter including median intraservice time, reviewing previous notes, labs, imaging, medications and explaining diagnosis and management.  Zelda W Fleming, FNP-BC 

## 2019-12-06 ENCOUNTER — Encounter: Payer: Self-pay | Admitting: Nurse Practitioner

## 2019-12-08 ENCOUNTER — Other Ambulatory Visit: Payer: Self-pay | Admitting: Nurse Practitioner

## 2019-12-08 ENCOUNTER — Other Ambulatory Visit (HOSPITAL_COMMUNITY): Payer: Self-pay | Admitting: Nurse Practitioner

## 2019-12-08 DIAGNOSIS — F419 Anxiety disorder, unspecified: Secondary | ICD-10-CM

## 2019-12-08 DIAGNOSIS — F331 Major depressive disorder, recurrent, moderate: Secondary | ICD-10-CM

## 2019-12-08 MED ORDER — HYDROXYZINE HCL 10 MG PO TABS: ORAL_TABLET | ORAL | 1 refills | Status: DC

## 2019-12-08 MED ORDER — ESCITALOPRAM OXALATE 10 MG PO TABS: ORAL_TABLET | ORAL | 2 refills | Status: DC

## 2019-12-08 MED FILL — ESCITALOPRAM 10 MG TABLET: 10 | 30 days supply | Qty: 30 | Fill #0

## 2019-12-08 MED FILL — hydrOXYzine HCL 10 MG TABS: 10 | 30 days supply | Qty: 30 | Fill #0

## 2019-12-13 ENCOUNTER — Other Ambulatory Visit: Payer: 59

## 2020-01-12 ENCOUNTER — Ambulatory Visit: Payer: 59 | Admitting: Nurse Practitioner

## 2020-03-05 ENCOUNTER — Ambulatory Visit: Payer: 59 | Admitting: Nurse Practitioner

## 2020-04-01 ENCOUNTER — Encounter (HOSPITAL_COMMUNITY): Payer: Self-pay

## 2020-04-01 ENCOUNTER — Inpatient Hospital Stay (HOSPITAL_COMMUNITY)
Admission: EM | Admit: 2020-04-01 | Discharge: 2020-04-04 | DRG: 638 | Disposition: A | Discharge status: Home / Self Care | Payer: 59 | Attending: Internal Medicine | Admitting: Internal Medicine

## 2020-04-01 ENCOUNTER — Other Ambulatory Visit: Payer: Self-pay

## 2020-04-01 DIAGNOSIS — Z8249 Family history of ischemic heart disease and other diseases of the circulatory system: Secondary | ICD-10-CM

## 2020-04-01 DIAGNOSIS — I10 Essential (primary) hypertension: Secondary | ICD-10-CM | POA: Diagnosis present

## 2020-04-01 DIAGNOSIS — E1100 Type 2 diabetes mellitus with hyperosmolarity without nonketotic hyperglycemic-hyperosmolar coma (NKHHC): Principal | ICD-10-CM | POA: Diagnosis present

## 2020-04-01 DIAGNOSIS — Z833 Family history of diabetes mellitus: Secondary | ICD-10-CM

## 2020-04-01 DIAGNOSIS — F418 Other specified anxiety disorders: Secondary | ICD-10-CM | POA: Diagnosis present

## 2020-04-01 DIAGNOSIS — E139 Other specified diabetes mellitus without complications: Secondary | ICD-10-CM

## 2020-04-01 DIAGNOSIS — R739 Hyperglycemia, unspecified: Secondary | ICD-10-CM

## 2020-04-01 DIAGNOSIS — Z20822 Contact with and (suspected) exposure to covid-19: Secondary | ICD-10-CM | POA: Diagnosis present

## 2020-04-01 DIAGNOSIS — Z83438 Family history of other disorder of lipoprotein metabolism and other lipidemia: Secondary | ICD-10-CM

## 2020-04-01 DIAGNOSIS — Z6841 Body Mass Index (BMI) 40.0 and over, adult: Secondary | ICD-10-CM

## 2020-04-01 DIAGNOSIS — E876 Hypokalemia: Secondary | ICD-10-CM | POA: Diagnosis present

## 2020-04-01 LAB — URINALYSIS, ROUTINE W REFLEX MICROSCOPIC
Bacteria, UA: NONE SEEN
Bilirubin Urine: NEGATIVE
Glucose, UA: >=500 mg/dL — AB
Hgb urine dipstick: NEGATIVE
Ketones, ur: 20 mg/dL — AB
Leukocytes,Ua: NEGATIVE
Nitrite: NEGATIVE
Protein, ur: NEGATIVE mg/dL
Specific Gravity, Urine: 1.028 (ref 1.005–1.030)
pH: 5 (ref 5.0–8.0)

## 2020-04-01 LAB — COMPREHENSIVE METABOLIC PANEL
ALT: 38 U/L (ref 0–44)
AST: 37 U/L (ref 15–41)
Albumin: 4.2 g/dL (ref 3.5–5.0)
Alkaline Phosphatase: 86 U/L (ref 38–126)
Anion gap: 12 (ref 5–15)
BUN: 14 mg/dL (ref 6–20)
CO2: 22 mmol/L (ref 22–32)
Calcium: 9.4 mg/dL (ref 8.9–10.3)
Chloride: 94 mmol/L — ABNORMAL LOW (ref 98–111)
Creatinine, Ser: 1.33 mg/dL — ABNORMAL HIGH (ref 0.61–1.24)
GFR, Estimated: >60 mL/min (ref >60)
Glucose, Bld: 751 mg/dL (ref 70–99)
Potassium: 4.7 mmol/L (ref 3.5–5.1)
Sodium: 128 mmol/L — ABNORMAL LOW (ref 135–145)
Total Bilirubin: 1.8 mg/dL — ABNORMAL HIGH (ref 0.3–1.2)
Total Protein: 7.3 g/dL (ref 6.5–8.1)

## 2020-04-01 LAB — CBC WITH DIFFERENTIAL/PLATELET
Abs Immature Granulocytes: 0.02 10*3/uL (ref 0.00–0.07)
Basophils Absolute: 0.1 10*3/uL (ref 0.0–0.1)
Basophils Relative: 1 %
Eosinophils Absolute: 0.3 10*3/uL (ref 0.0–0.5)
Eosinophils Relative: 3 %
HCT: 38.7 % — ABNORMAL LOW (ref 39.0–52.0)
Hemoglobin: 13.2 g/dL (ref 13.0–17.0)
Immature Granulocytes: 0 %
Lymphocytes Relative: 30 %
Lymphs Abs: 3.1 10*3/uL (ref 0.7–4.0)
MCH: 30.7 pg (ref 26.0–34.0)
MCHC: 34.1 g/dL (ref 30.0–36.0)
MCV: 90 fL (ref 80.0–100.0)
Monocytes Absolute: 1 10*3/uL (ref 0.1–1.0)
Monocytes Relative: 10 %
Neutro Abs: 5.7 10*3/uL (ref 1.7–7.7)
Neutrophils Relative %: 56 %
Platelets: 296 10*3/uL (ref 150–400)
RBC: 4.3 MIL/uL (ref 4.22–5.81)
RDW: 11.5 % (ref 11.5–15.5)
WBC: 10.1 10*3/uL (ref 4.0–10.5)
nRBC: 0 % (ref 0.0–0.2)

## 2020-04-01 LAB — CBG MONITORING, ED
Glucose-Capillary: >600 mg/dL (ref 70–99)
Glucose-Capillary: 390 mg/dL — ABNORMAL HIGH (ref 70–99)
Glucose-Capillary: 459 mg/dL — ABNORMAL HIGH (ref 70–99)

## 2020-04-01 MED ORDER — ACETAMINOPHEN 325 MG PO TABS: 650.0000 mg | ORAL_TABLET | Freq: Four times a day (QID) | ORAL | Status: DC | PRN

## 2020-04-01 MED ORDER — ENOXAPARIN SODIUM 40 MG/0.4ML ~~LOC~~ SOLN
40.0000 mg | SUBCUTANEOUS | Status: DC
  Administered 2020-04-02: 40 mg via SUBCUTANEOUS
  Filled 2020-04-01: qty 0.4

## 2020-04-01 MED ORDER — HYDRALAZINE HCL 25 MG PO TABS
25.0000 mg | ORAL_TABLET | Freq: Four times a day (QID) | ORAL | Status: DC | PRN
  Administered 2020-04-01: 25 mg via ORAL
  Filled 2020-04-01: qty 1

## 2020-04-01 MED ORDER — POTASSIUM CHLORIDE 10 MEQ/100ML IV SOLN
10.0000 meq | INTRAVENOUS | Status: AC
  Administered 2020-04-02: 10 meq via INTRAVENOUS
  Filled 2020-04-01: qty 100

## 2020-04-01 MED ORDER — DEXTROSE IN LACTATED RINGERS 5 % IV SOLN: INTRAVENOUS | Status: DC

## 2020-04-01 MED ORDER — SODIUM CHLORIDE 0.9 % IV BOLUS
1000.0000 mL | Freq: Once | INTRAVENOUS | Status: AC
  Administered 2020-04-01: 1000 mL via INTRAVENOUS

## 2020-04-01 MED ORDER — ONDANSETRON HCL 4 MG/2ML IJ SOLN: 4.0000 mg | Freq: Four times a day (QID) | INTRAMUSCULAR | Status: DC | PRN

## 2020-04-01 MED ORDER — ENOXAPARIN SODIUM 40 MG/0.4ML ~~LOC~~ SOLN: 40.0000 mg | SUBCUTANEOUS | Status: DC

## 2020-04-01 MED ORDER — ONDANSETRON HCL 4 MG PO TABS: 4.0000 mg | ORAL_TABLET | Freq: Four times a day (QID) | ORAL | Status: DC | PRN

## 2020-04-01 MED ORDER — LACTATED RINGERS IV SOLN: INTRAVENOUS | Status: DC

## 2020-04-01 MED ORDER — ACETAMINOPHEN 650 MG RE SUPP: 650.0000 mg | Freq: Four times a day (QID) | RECTAL | Status: DC | PRN

## 2020-04-01 MED ORDER — DEXTROSE 50 % IV SOLN: 0.0000 mL | INTRAVENOUS | Status: DC | PRN

## 2020-04-01 MED ORDER — INSULIN REGULAR(HUMAN) IN NACL 100-0.9 UT/100ML-% IV SOLN
INTRAVENOUS | Status: DC
  Administered 2020-04-01: 18 [IU]/h via INTRAVENOUS
  Filled 2020-04-01: qty 100

## 2020-04-01 NOTE — ED Provider Notes (Signed)
[facility]-EMERGENCY DEPT Provider Note   CSN: [id number] Arrival date & time: 04/01/20  2006     History Chief Complaint  Patient presents with  . Urinary Frequency    Jose Burgess is a 35 y.o. male.  Patient complains of urinary frequency.  And weakness  The history is provided by the patient and medical records. No language interpreter was used.  Urinary Frequency This is a new problem. The current episode started more than 2 days ago. The problem occurs constantly. The problem has not changed since onset.Pertinent negatives include no chest pain, no abdominal pain and no headaches. Nothing aggravates the symptoms. Nothing relieves the symptoms. He has tried nothing for the symptoms. The treatment provided no relief.       Past Medical History:  Diagnosis Date  . Anxiety   . Depression     Patient Active Problem List   Diagnosis Date Noted  . 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 Class 3 severe obesity due to excess calories with body mass index (BMI) of 40.0 to 44.9 in adult (HCC) 11/25/2017  . Vitiligo 11/25/2017    Past Surgical History:  Procedure Laterality Date  . NO PAST SURGERIES         Family History  Problem Relation Age of Onset  . Anxiety disorder Mother   . Hyperlipidemia Mother   . Diabetes Mother   . Heart disease Father   . Hyperlipidemia Father   . Diabetes Father     Social History   Tobacco Use  . Smoking status: Never Smoker  . Smokeless tobacco: Never Used  Substance Use Topics  . Alcohol use: Yes    Comment: Occasionally   . Drug use: Not Currently    Home Medications Prior to Admission medications   Medication Sig Start Date End Date Taking? Authorizing Provider  doxycycline (VIBRAMYCIN) 100 MG capsule Take 1 capsule (100 mg total) by mouth 2 (two) times daily. One po bid x 7 days Patient not taking: Reported on 12/05/2019 06/08/19   Mancel Bale, MD  escitalopram  (LEXAPRO) 10 MG tablet Take one tab daily 12/08/19   Claiborne Rigg, NP  Guaifenesin [facility] MAXIMUM STRENGTH) 1200 MG TB12 Take 1,200 tablets by mouth 2 (two) times daily as needed (for cough). Patient not taking: Reported on 12/05/2019    [provider]  hydrOXYzine (ATARAX/VISTARIL) 10 MG tablet Take one tab as needed for anxiety 12/08/19   Claiborne Rigg, NP    Allergies    Patient has no known allergies.  Review of Systems   Review of Systems  Constitutional: Negative for appetite change and fatigue.  HENT: Negative for congestion, ear discharge and sinus pressure.   Eyes: Negative for discharge.  Respiratory: Negative for cough.   Cardiovascular: Negative for chest pain.  Gastrointestinal: Negative for abdominal pain and diarrhea.  Genitourinary: Positive for frequency. Negative for hematuria.  Musculoskeletal: Negative for back pain.  Skin: Negative for rash.  Neurological: Negative for seizures and headaches.  Psychiatric/Behavioral: Negative for hallucinations.    Physical Exam Updated Vital Signs BP (!) 152/88   Pulse 90   Temp 98.2 F (36.8 C) (Oral)   Resp (!) 25   Ht 5\' 10"  (1.778 m)   Wt 136.1 kg   SpO2 97%   BMI 43.05 kg/m   Physical Exam Vitals reviewed.  Constitutional:      Appearance: He is well-developed.  HENT:     Head: Normocephalic.  Nose: Nose normal.  Eyes:     General: No scleral icterus.    Extraocular Movements: EOM normal.     Conjunctiva/sclera: Conjunctivae normal.  Neck:     Thyroid: No thyromegaly.  Cardiovascular:     Rate and Rhythm: Normal rate and regular rhythm.     Heart sounds: No murmur heard. No friction rub. No gallop.   Pulmonary:     Breath sounds: No stridor. No wheezing or rales.  Chest:     Chest wall: No tenderness.  Abdominal:     General: There is no distension.     Tenderness: There is no abdominal tenderness. There is no rebound.  Musculoskeletal:        General: No edema. Normal range  of motion.     Cervical back: Neck supple.  Lymphadenopathy:     Cervical: No cervical adenopathy.  Skin:    Findings: No erythema or rash.  Neurological:     Mental Status: He is alert and oriented to person, place, and time.     Motor: No abnormal muscle tone.     Coordination: Coordination normal.  Psychiatric:        Mood and Affect: Mood and affect normal.        Behavior: Behavior normal.     ED Results / Procedures / Treatments   Labs (all labs ordered are listed, but only abnormal results are displayed) Labs Reviewed  URINALYSIS, ROUTINE W REFLEX MICROSCOPIC - Abnormal; Notable for the following components:      Result Value   Color, Urine COLORLESS (*)    Glucose, UA >=500 (*)    Ketones, ur 20 (*)    All other components within normal limits  CBC WITH DIFFERENTIAL/PLATELET - Abnormal; Notable for the following components:   HCT 38.7 (*)    All other components within normal limits  COMPREHENSIVE METABOLIC PANEL - Abnormal; Notable for the following components:   Sodium 128 (*)    Chloride 94 (*)    Glucose, Bld 751 (*)    Creatinine, Ser 1.33 (*)    Total Bilirubin 1.8 (*)    All other components within normal limits  CBG MONITORING, ED - Abnormal; Notable for the following components:   Glucose-Capillary >600 (*)    All other components within normal limits  CBG MONITORING, ED - Abnormal; Notable for the following components:   Glucose-Capillary 459 (*)    All other components within normal limits    EKG None  Radiology No results found.  Procedures Procedures   Medications Ordered in ED Medications  insulin regular, human (MYXREDLIN) 100 units/ 100 mL infusion (18 Units/hr Intravenous New Bag/Given 04/01/20 2239)  lactated ringers infusion ( Intravenous New Bag/Given 04/01/20 2236)  dextrose 5 % in lactated ringers infusion (0 mLs Intravenous Hold 04/01/20 2220)  dextrose 50 % solution 0-50 mL (has no administration in time range)  sodium chloride 0.9  % bolus 1,000 mL (1,000 mLs Intravenous New Bag/Given 04/01/20 2153)    ED Course  I have reviewed the triage vital signs and the nursing notes.  Pertinent labs & imaging results that were available during my care of the patient were reviewed by me and considered in my medical decision making (see chart for details). CRITICAL CARE Performed by: Bethann Berkshire Total critical care time: 45 minutes Critical care time was exclusive of separately billable procedures and treating other patients. Critical care was necessary to treat or prevent imminent or life-threatening deterioration. Critical care was time spent  personally by me on the following activities: development of treatment plan with patient and/or surrogate as well as nursing, discussions with consultants, evaluation of patient's response to treatment, examination of patient, obtaining history from patient or surrogate, ordering and performing treatments and interventions, ordering and review of laboratory studies, ordering and review of radiographic studies, pulse oximetry and re-evaluation of patient's condition.    MDM Rules/Calculators/A&P                          Patient with new onset diabetes and hypertension.  He will be admitted to medicine Final Clinical Impression(s) / ED Diagnoses Final diagnoses:  Diabetes 1.5, managed as type 2 [facility])    Rx / DC Orders ED Discharge Orders    None       Bethann Berkshire, MD 04/01/20 2246

## 2020-04-01 NOTE — ED Notes (Signed)
ED TO INPATIENT HANDOFF REPORT  Name/Age/Gender Jose Burgess 35 y.o. 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]  Level of Care/Admitting Diagnosis ED Disposition    ED Disposition Condition Comment   Admit  Hospital Area: [facility] [[id number]]  Level of Care: Stepdown [14]  Admit to SDU based on following criteria: Severe physiological/psychological symptoms:  Any diagnosis requiring assessment & intervention at least every 4 hours on an ongoing basis to obtain desired patient outcomes including stability and rehabilitation  Covid Evaluation: Asymptomatic Screening Protocol (No Symptoms)  Diagnosis: Diabetes mellitus with nonketotic hyperosmolarity [facility]) [[id number]]  Admitting Physician: Bobette Mo [[id number]]  Attending Physician: Bobette Mo [[id number]]       Medical History Past Medical History:  Diagnosis Date  . Anxiety   . Depression     Allergies No Known Allergies  IV Location/Drains/Wounds Patient Lines/Drains/Airways Status    Active Line/Drains/Airways    Name Placement date Placement time Site Days   Peripheral IV 04/01/20 Left Antecubital 04/01/20  2148  Antecubital  less than 1   Peripheral IV 04/01/20 Anterior;Left Forearm 04/01/20  2236  Forearm  less than 1          Labs/Imaging Results for orders placed or performed during the hospital encounter of 04/01/20 (from the past 48 hour(s))  CBG monitoring, ED     Status: Abnormal   Collection Time: 04/01/20  8:21 PM  Result Value Ref  Range   Glucose-Capillary >600 (HH) 70 - 99 mg/dL    Comment: Glucose reference range applies only to samples taken after fasting for at least 8 hours.   Comment 1 Notify RN    Comment 2 Document in Chart   Urinalysis, Routine w reflex microscopic Urine, Clean Catch     Status: Abnormal   Collection Time: 04/01/20  8:24 PM  Result Value Ref Range   Color, Urine COLORLESS (A) YELLOW   APPearance CLEAR CLEAR   Specific Gravity, Urine 1.028 1.005 - 1.030   pH 5.0 5.0 - 8.0   Glucose, UA >=500 (A) NEGATIVE mg/dL   Hgb urine dipstick NEGATIVE NEGATIVE   Bilirubin Urine NEGATIVE NEGATIVE   Ketones, ur 20 (A) NEGATIVE mg/dL   Protein, ur NEGATIVE NEGATIVE mg/dL   Nitrite NEGATIVE NEGATIVE   Leukocytes,Ua NEGATIVE NEGATIVE   RBC / HPF 0-5 0 - 5 RBC/hpf   WBC, UA 0-5 0 - 5 WBC/hpf   Bacteria, UA NONE SEEN NONE SEEN   Squamous Epithelial / LPF 0-5 0 - 5   Mucus PRESENT     Comment: Performed at [facility], [address] [place], [place] [address]  CBC with Differential     Status: Abnormal   Collection Time: 04/01/20  8:29 PM  Result Value Ref Range   WBC 10.1 4.0 - 10.5 K/uL   RBC 4.30 4.22 - 5.81 MIL/uL   Hemoglobin 13.2 13.0 -  17.0 g/dL   HCT 38.1 (L) 82.9 - 93.7 %   MCV 90.0 80.0 - 100.0 fL   MCH 30.7 26.0 - 34.0 pg   MCHC 34.1 30.0 - 36.0 g/dL   RDW 16.9 67.8 - 93.8 %   Platelets 296 150 - 400 K/uL   nRBC 0.0 0.0 - 0.2 %   Neutrophils Relative % 56 %   Neutro Abs 5.7 1.7 - 7.7 K/uL   Lymphocytes Relative 30 %   Lymphs Abs 3.1 0.7 - 4.0 K/uL   Monocytes Relative 10 %   Monocytes Absolute 1.0 0.1 - 1.0 K/uL   Eosinophils Relative 3 %   Eosinophils Absolute 0.3 0.0 - 0.5 K/uL   Basophils Relative 1 %   Basophils Absolute 0.1 0.0 - 0.1 K/uL   Immature Granulocytes 0 %   Abs Immature Granulocytes 0.02 0.00 - 0.07 K/uL    Comment: Performed at [facility], [address], [place], [place] [address]  Comprehensive metabolic panel      Status: Abnormal   Collection Time: 04/01/20  8:29 PM  Result Value Ref Range   Sodium 128 (L) 135 - 145 mmol/L   Potassium 4.7 3.5 - 5.1 mmol/L   Chloride 94 (L) 98 - 111 mmol/L   CO2 22 22 - 32 mmol/L   Glucose, Bld 751 (HH) 70 - 99 mg/dL    Comment: Glucose reference range applies only to samples taken after fasting for at least 8 hours. CRITICAL RESULT CALLED TO, READ BACK BY AND VERIFIED WITH: Shermar Friedland S. 03.07.22 @ 2140 BY MECIAL J.    BUN 14 6 - 20 mg/dL   Creatinine, Ser 1.02 (H) 0.61 - 1.24 mg/dL   Calcium 9.4 8.9 - 58.5 mg/dL   Total Protein 7.3 6.5 - 8.1 g/dL   Albumin 4.2 3.5 - 5.0 g/dL   AST 37 15 - 41 U/L   ALT 38 0 - 44 U/L   Alkaline Phosphatase 86 38 - 126 U/L   Total Bilirubin 1.8 (H) 0.3 - 1.2 mg/dL   GFR, Estimated >27 >78 mL/min    Comment: (NOTE) Calculated using the CKD-EPI Creatinine Equation (2021)    Anion gap 12 5 - 15    Comment: Performed at [facility], [address], [place], [place] [address]  CBG monitoring, ED     Status: Abnormal   Collection Time: 04/01/20 10:34 PM  Result Value Ref Range   Glucose-Capillary 459 (H) 70 - 99 mg/dL    Comment: Glucose reference range applies only to samples taken after fasting for at least 8 hours.  CBG monitoring, ED     Status: Abnormal   Collection Time: 04/01/20 11:17 PM  Result Value Ref Range   Glucose-Capillary 390 (H) 70 - 99 mg/dL    Comment: Glucose reference range applies only to samples taken after fasting for at least 8 hours.   No results found.  Pending Labs Unresulted Labs (From admission, onward)          Start     Ordered   04/08/20 0500  Creatinine, serum  (enoxaparin (LOVENOX)    CrCl >/= 30 ml/min)  Weekly,   R     Comments: while on enoxaparin therapy    04/01/20 2314   04/08/20 0500  Creatinine, serum  (enoxaparin (LOVENOX)    CrCl >/= 30 ml/min)  Weekly,   R     Comments: while on enoxaparin therapy    04/01/20 2314   04/02/20  0500  HIV Antibody (routine  testing w rflx)  (HIV Antibody (Routine testing w reflex) panel)  Tomorrow morning,   R        04/01/20 2314   04/02/20 0500  Magnesium  Tomorrow morning,   R        04/01/20 2314   04/02/20 0500  Phosphorus  Tomorrow morning,   R        04/01/20 2314   04/02/20 0030  Basic metabolic panel  (Hyperglycemic Hyperosmolar State (HHS))  STAT Now then every 4 hours ,   STAT      04/01/20 2314   04/01/20 2312  Osmolality  (Hyperglycemic Hyperosmolar State (HHS))  Once,   STAT        04/01/20 2314   04/01/20 2312  Hemoglobin A1c  (Hyperglycemic Hyperosmolar State (HHS))  Add-on,   AD       Comments: To assess prior glycemic control.    04/01/20 2314   04/01/20 2307  SARS CORONAVIRUS 2 (TAT 6-24 HRS) Nasopharyngeal Nasopharyngeal Swab  (Tier 3 - Symptomatic/asymptomatic with Precautions)  Once,   STAT       Question Answer Comment  Is this test for diagnosis or screening Screening   Symptomatic for COVID-19 as defined by CDC No   Hospitalized for COVID-19 No   Admitted to ICU for COVID-19 No   Previously tested for COVID-19 No   Resident in a congregate (group) care setting No   Employed in healthcare setting No   Has patient completed COVID vaccination(s) (2 doses of Pfizer/Moderna 1 dose of [employer]) Unknown      04/01/20 2307          Vitals/Pain Today's Vitals   04/01/20 2015 04/01/20 2148 04/01/20 2200 04/01/20 2300  BP:  (!) 179/104 (!) 152/88 (!) 179/112  Pulse:  80 90 84  Resp:  (!) 21 (!) 25 (!) 26  Temp:  98.2 F (36.8 C)    TempSrc:  Oral    SpO2:  97% 97% 100%  Weight:      Height:      PainSc: 0-No pain       Isolation Precautions Airborne and Contact precautions - awaiting covid swab results   Medications Medications  insulin regular, human (MYXREDLIN) 100 units/ 100 mL infusion (18 Units/hr Intravenous New Bag/Given 04/01/20 2239)  lactated ringers infusion ( Intravenous New Bag/Given 04/01/20 2236)  dextrose 5 % in lactated ringers infusion (0 mLs  Intravenous Hold 04/01/20 2220)  dextrose 50 % solution 0-50 mL (has no administration in time range)  hydrALAZINE (APRESOLINE) tablet 25 mg (has no administration in time range)  potassium chloride 10 mEq in 100 mL IVPB (has no administration in time range)  enoxaparin (LOVENOX) injection 40 mg (has no administration in time range)  acetaminophen (TYLENOL) tablet 650 mg (has no administration in time range)    Or  acetaminophen (TYLENOL) suppository 650 mg (has no administration in time range)  ondansetron (ZOFRAN) tablet 4 mg (has no administration in time range)    Or  ondansetron (ZOFRAN) injection 4 mg (has no administration in time range)  sodium chloride 0.9 % bolus 1,000 mL (1,000 mLs Intravenous New Bag/Given 04/01/20 2153)    Mobility Independent

## 2020-04-01 NOTE — ED Triage Notes (Addendum)
Pt sts frequent urination and going to restroom every 20-30 minutes for a week and a half. CBG in triage HIGH. No hx of diabetes.

## 2020-04-01 NOTE — H&P (Signed)
History and Physical    Jose Burgess HOO:[id number] DOB: 10-29-1985 DOA: 04/01/2020  PCP: Claiborne Rigg, NP   Patient coming from: Home.   I have personally briefly reviewed patient's old medical records in [facility]  Chief Complaint: Frequent urination and increased thirst.  HPI: Jose Burgess is a 35 y.o. male with medical history significant of anxiety, depression, class III obesity, previous elevated blood pressure readings not formally diagnosed hypertension who is coming to the emergency department with week and a half history of polyuria and polydipsia.  Both of his parents are diabetic.  He denies blurred vision, headache, sore throat, rhinorrhea, wheezing or hemoptysis.  No chest pain, palpitations, diaphoresis, PND, orthopnea or pitting edema of the lower extremities.  Denies abdominal pain, nausea, vomiting, diarrhea, constipation, melena or hematochezia.  No dysuria or hematuria.  No depressive symptoms at this time, but feels a little anxious due to being diagnosed with diabetes.  ED Course: Initial vital signs were temperature 98.5 F, pulse 97, respirations 17, blood pressure 146/100 mmHg O2 sat 99% on room air.  The patient was given IV fluids and started on a continuous insulin infusion.  He also received potassium replacement.  Lab work: His urinalysis showed glucosuria more than 500 and ketonuria 20 mg/dL.  CBC was unremarkable.  CMP showed a sodium 128, potassium 4.7, chloride 94 and CO2 22 mmol/L.  Anion gap was 14.  Glucose 751, creatinine 1.33 and total bilirubin 1.8 mg/dL.  The rest of the CMP measurements are within normal limits.  Review of Systems: As per HPI otherwise all other systems reviewed and are negative.  Past Medical History:  Diagnosis Date  . Anxiety   . Depression     Past Surgical History:  Procedure Laterality Date  . NO PAST SURGERIES     Social History  reports that he has never smoked. He has never used smokeless tobacco. He  reports current alcohol use. He reports previous drug use.  No Known Allergies  Family History  Problem Relation Age of Onset  . Anxiety disorder Mother   . Hyperlipidemia Mother   . Diabetes Mother   . Heart disease Father   . Hyperlipidemia Father   . Diabetes Father    Prior to Admission medications   Not on File   Physical Exam: Vitals:   04/01/20 2012 04/01/20 2148 04/01/20 2200 04/01/20 2300  BP: (!) 146/100 (!) 179/104 (!) 152/88 (!) 179/112  Pulse: 97 80 90 84  Resp: 17 (!) 21 (!) 25 (!) 26  Temp: 98.5 F (36.9 C) 98.2 F (36.8 C)    TempSrc: Oral Oral    SpO2: 99% 97% 97% 100%  Weight: 136.1 kg     Height: 5\' 10"  (1.778 m)      Constitutional: NAD, calm, comfortable Eyes: PERRL, lids and conjunctivae normal ENMT: Mucous membranes are mildly dry. Posterior pharynx clear of any exudate or lesions. Neck: normal, supple, no masses, no thyromegaly Respiratory: clear to auscultation bilaterally, no wheezing, no crackles. Normal respiratory effort. No accessory muscle use.  Cardiovascular: Regular rate and rhythm, no murmurs / rubs / gallops. No extremity edema. 2+ pedal pulses. No carotid bruits.  Abdomen: Obese.  Bowel sounds positive.  Soft nontender, no masses palpated. No hepatosplenomegaly.  Musculoskeletal: no clubbing / cyanosis.  Good ROM, no contractures. Normal muscle tone.  Skin: Multiple patches of hypopigmentation secondary to vitiligo. Neurologic: CN 2-12 grossly intact. Sensation intact, DTR normal. Strength 5/5 in all 4.  Psychiatric:  Normal judgment and insight. Alert and oriented x 3. Normal mood.   Labs on Admission: I have personally reviewed following labs and imaging studies  CBC: Recent Labs  Lab 04/01/20 2029  WBC 10.1  NEUTROABS 5.7  HGB 13.2  HCT 38.7*  MCV 90.0  PLT 296    Basic Metabolic Panel: Recent Labs  Lab 04/01/20 2029  NA 128*  K 4.7  CL 94*  CO2 22  GLUCOSE 751*  BUN 14  CREATININE 1.33*  CALCIUM 9.4     GFR: Estimated Creatinine Clearance: 109.7 mL/min (A) (by C-G formula based on SCr of 1.33 mg/dL (H)).  Liver Function Tests: Recent Labs  Lab 04/01/20 2029  AST 37  ALT 38  ALKPHOS 86  BILITOT 1.8*  PROT 7.3  ALBUMIN 4.2    Urine analysis:    Component Value Date/Time   COLORURINE COLORLESS (A) 04/01/2020 2024   APPEARANCEUR CLEAR 04/01/2020 2024   LABSPEC 1.028 04/01/2020 2024   PHURINE 5.0 04/01/2020 2024   GLUCOSEU >=500 (A) 04/01/2020 2024   HGBUR NEGATIVE 04/01/2020 2024   BILIRUBINUR NEGATIVE 04/01/2020 2024   KETONESUR 20 (A) 04/01/2020 2024   PROTEINUR NEGATIVE 04/01/2020 2024   NITRITE NEGATIVE 04/01/2020 2024   LEUKOCYTESUR NEGATIVE 04/01/2020 2024   Radiological Exams on Admission: No results found.  EKG: Independently reviewed.  Assessment/Plan Principal Problem:   Diabetes mellitus with nonketotic hyperosmolarity (HCC) Observation/stepdown. Keep n.p.o. Continue IV fluids. Continue insulin infusion. Monitor CBG hourly. BMP every 4 hours. Replace electrolytes as needed. Consult diabetes coordinator.  Active Problems:   Class 3 severe obesity due to excess calories with body mass index (BMI) of 40.0 to 44.9 in adult [facility]) Needs substantial lifestyle modifications. Consulted diabetes coordinator for initial DM education. Follow-up with PCP closely.    Anxiety with depression Not depressed at this time. Denies SI or HI thoughts. Feels a little anxious. Follow-up with PCP.    Hypertension Begin lisinopril 5 mg p.o. daily. Patient warned about rare risk of angioedema. He agrees to seek medical attention if this develops.    DVT prophylaxis: Lovenox SQ. Code Status:   Full code. Family Communication:   Disposition Plan:   Patient is from:  Home.  Anticipated DC to:  Home.  Anticipated DC date:  04/02/2020 or 04/03/2020.  Anticipated DC barriers: Clinical status.  Consults called: Admission status:  Observation/stepdown.    Severity of Illness: High due to newly diagnosed diabetes presenting with hyperosmolar state requiring IV hydration and continuous insulin infusion.  Bobette Mo MD [facility]  How to contact the [facility] Attending or Consulting provider 7A - 7P or covering provider during after hours 7P -7A, for this patient?   1. Check the care team in [facility] and look for a) attending/consulting [facility] provider listed and b) the [facility] team listed 2. Log into [email or web address] and use [place]'s universal password to access. If you do not have the password, please contact the hospital operator. 3. Locate the [facility] provider you are looking for under [facility] and page to a number that you can be directly reached. 4. If you still have difficulty reaching the provider, please page the [facility] (Director on Call) for the Hospitalists listed on amion for assistance.  04/01/2020, 11:09 PM   This document was prepared using Dragon voice recognition software and may contain some unintended transcription errors.

## 2020-04-02 ENCOUNTER — Observation Stay (HOSPITAL_COMMUNITY): Payer: 59

## 2020-04-02 DIAGNOSIS — F418 Other specified anxiety disorders: Secondary | ICD-10-CM

## 2020-04-02 DIAGNOSIS — E876 Hypokalemia: Secondary | ICD-10-CM

## 2020-04-02 DIAGNOSIS — Z6841 Body Mass Index (BMI) 40.0 and over, adult: Secondary | ICD-10-CM

## 2020-04-02 DIAGNOSIS — I10 Essential (primary) hypertension: Secondary | ICD-10-CM

## 2020-04-02 LAB — GLUCOSE, CAPILLARY
Glucose-Capillary: 194 mg/dL — ABNORMAL HIGH (ref 70–99)
Glucose-Capillary: 167 mg/dL — ABNORMAL HIGH (ref 70–99)
Glucose-Capillary: 181 mg/dL — ABNORMAL HIGH (ref 70–99)
Glucose-Capillary: 191 mg/dL — ABNORMAL HIGH (ref 70–99)
Glucose-Capillary: 158 mg/dL — ABNORMAL HIGH (ref 70–99)
Glucose-Capillary: 141 mg/dL — ABNORMAL HIGH (ref 70–99)
Glucose-Capillary: 187 mg/dL — ABNORMAL HIGH (ref 70–99)
Glucose-Capillary: 147 mg/dL — ABNORMAL HIGH (ref 70–99)
Glucose-Capillary: 233 mg/dL — ABNORMAL HIGH (ref 70–99)
Glucose-Capillary: 265 mg/dL — ABNORMAL HIGH (ref 70–99)
Glucose-Capillary: 262 mg/dL — ABNORMAL HIGH (ref 70–99)

## 2020-04-02 LAB — CBC WITH DIFFERENTIAL/PLATELET
Abs Immature Granulocytes: 0.02 10*3/uL (ref 0.00–0.07)
Basophils Absolute: 0.1 10*3/uL (ref 0.0–0.1)
Basophils Relative: 1 %
Eosinophils Absolute: 0.3 10*3/uL (ref 0.0–0.5)
Eosinophils Relative: 4 %
HCT: 37.3 % — ABNORMAL LOW (ref 39.0–52.0)
Hemoglobin: 12.7 g/dL — ABNORMAL LOW (ref 13.0–17.0)
Immature Granulocytes: 0 %
Lymphocytes Relative: 28 %
Lymphs Abs: 2.3 10*3/uL (ref 0.7–4.0)
MCH: 31.1 pg (ref 26.0–34.0)
MCHC: 34 g/dL (ref 30.0–36.0)
MCV: 91.2 fL (ref 80.0–100.0)
Monocytes Absolute: 0.9 10*3/uL (ref 0.1–1.0)
Monocytes Relative: 11 %
Neutro Abs: 4.7 10*3/uL (ref 1.7–7.7)
Neutrophils Relative %: 56 %
Platelets: 270 10*3/uL (ref 150–400)
RBC: 4.09 MIL/uL — ABNORMAL LOW (ref 4.22–5.81)
RDW: 11.5 % (ref 11.5–15.5)
WBC: 8.3 10*3/uL (ref 4.0–10.5)
nRBC: 0 % (ref 0.0–0.2)

## 2020-04-02 LAB — BASIC METABOLIC PANEL
Anion gap: 11 (ref 5–15)
Anion gap: 11 (ref 5–15)
Anion gap: 9 (ref 5–15)
BUN: 14 mg/dL (ref 6–20)
BUN: 13 mg/dL (ref 6–20)
BUN: 10 mg/dL (ref 6–20)
CO2: 21 mmol/L — ABNORMAL LOW (ref 22–32)
CO2: 22 mmol/L (ref 22–32)
CO2: 24 mmol/L (ref 22–32)
Calcium: 9.1 mg/dL (ref 8.9–10.3)
Calcium: 9.1 mg/dL (ref 8.9–10.3)
Calcium: 8.9 mg/dL (ref 8.9–10.3)
Chloride: 105 mmol/L (ref 98–111)
Chloride: 106 mmol/L (ref 98–111)
Chloride: 105 mmol/L (ref 98–111)
Creatinine, Ser: 1.04 mg/dL (ref 0.61–1.24)
Creatinine, Ser: 0.91 mg/dL (ref 0.61–1.24)
Creatinine, Ser: 0.97 mg/dL (ref 0.61–1.24)
GFR, Estimated: >60 mL/min (ref >60)
GFR, Estimated: >60 mL/min (ref >60)
GFR, Estimated: >60 mL/min (ref >60)
Glucose, Bld: 291 mg/dL — ABNORMAL HIGH (ref 70–99)
Glucose, Bld: 195 mg/dL — ABNORMAL HIGH (ref 70–99)
Glucose, Bld: 174 mg/dL — ABNORMAL HIGH (ref 70–99)
Potassium: 3.1 mmol/L — ABNORMAL LOW (ref 3.5–5.1)
Potassium: 3 mmol/L — ABNORMAL LOW (ref 3.5–5.1)
Potassium: 3.2 mmol/L — ABNORMAL LOW (ref 3.5–5.1)
Sodium: 137 mmol/L (ref 135–145)
Sodium: 139 mmol/L (ref 135–145)
Sodium: 138 mmol/L (ref 135–145)

## 2020-04-02 LAB — MRSA PCR SCREENING: MRSA by PCR: NEGATIVE

## 2020-04-02 LAB — HEMOGLOBIN A1C
Hgb A1c MFr Bld: 11.8 % — ABNORMAL HIGH (ref 4.8–5.6)
Mean Plasma Glucose: 291.96 mg/dL

## 2020-04-02 LAB — OSMOLALITY: Osmolality: 304 mOsm/kg — ABNORMAL HIGH (ref 275–295)

## 2020-04-02 LAB — PHOSPHORUS: Phosphorus: 3.6 mg/dL (ref 2.5–4.6)

## 2020-04-02 LAB — HIV ANTIBODY (ROUTINE TESTING W REFLEX): HIV Screen 4th Generation wRfx: NONREACTIVE

## 2020-04-02 LAB — MAGNESIUM: Magnesium: 2 mg/dL (ref 1.7–2.4)

## 2020-04-02 LAB — SARS CORONAVIRUS 2 (TAT 6-24 HRS): SARS Coronavirus 2: NEGATIVE

## 2020-04-02 LAB — CBG MONITORING, ED: Glucose-Capillary: 286 mg/dL — ABNORMAL HIGH (ref 70–99)

## 2020-04-02 MED ORDER — CHLORHEXIDINE GLUCONATE CLOTH 2 % EX PADS
6.0000 | MEDICATED_PAD | Freq: Every day | CUTANEOUS | Status: DC
  Administered 2020-04-02 – 2020-04-04 (×4): 6 via TOPICAL

## 2020-04-02 MED ORDER — INSULIN ASPART 100 UNIT/ML ~~LOC~~ SOLN
6.0000 [IU] | Freq: Three times a day (TID) | SUBCUTANEOUS | Status: DC
  Administered 2020-04-02 – 2020-04-03 (×4): 6 [IU] via SUBCUTANEOUS

## 2020-04-02 MED ORDER — INSULIN ASPART 100 UNIT/ML ~~LOC~~ SOLN
0.0000 [IU] | Freq: Three times a day (TID) | SUBCUTANEOUS | Status: DC
  Administered 2020-04-02: 3 [IU] via SUBCUTANEOUS
  Administered 2020-04-02: 1 [IU] via SUBCUTANEOUS
  Administered 2020-04-02 – 2020-04-03 (×4): 5 [IU] via SUBCUTANEOUS

## 2020-04-02 MED ORDER — PANTOPRAZOLE SODIUM 40 MG PO TBEC
40.0000 mg | DELAYED_RELEASE_TABLET | Freq: Every day | ORAL | Status: DC
  Administered 2020-04-02 – 2020-04-04 (×3): 40 mg via ORAL
  Filled 2020-04-02 (×2): qty 1

## 2020-04-02 MED ORDER — POTASSIUM CHLORIDE CRYS ER 20 MEQ PO TBCR
40.0000 meq | EXTENDED_RELEASE_TABLET | Freq: Two times a day (BID) | ORAL | Status: DC
  Administered 2020-04-02: 40 meq via ORAL
  Filled 2020-04-02: qty 2

## 2020-04-02 MED ORDER — LISINOPRIL 5 MG PO TABS
5.0000 mg | ORAL_TABLET | Freq: Every day | ORAL | Status: DC
  Administered 2020-04-02 – 2020-04-04 (×3): 5 mg via ORAL
  Filled 2020-04-02 (×2): qty 1
  Filled 2020-04-02: qty 2

## 2020-04-02 MED ORDER — INSULIN ASPART 100 UNIT/ML ~~LOC~~ SOLN
4.0000 [IU] | Freq: Once | SUBCUTANEOUS | Status: AC
  Administered 2020-04-02: 4 [IU] via SUBCUTANEOUS

## 2020-04-02 MED ORDER — INSULIN STARTER KIT- PEN NEEDLES (ENGLISH)
1.0000 | Freq: Once | Status: DC
  Filled 2020-04-02: qty 1

## 2020-04-02 MED ORDER — LIVING WELL WITH DIABETES BOOK
Freq: Once | Status: DC
  Filled 2020-04-02: qty 1

## 2020-04-02 MED ORDER — INSULIN GLARGINE 100 UNIT/ML ~~LOC~~ SOLN
20.0000 [IU] | Freq: Every day | SUBCUTANEOUS | Status: DC
  Administered 2020-04-02 – 2020-04-03 (×2): 20 [IU] via SUBCUTANEOUS
  Filled 2020-04-02 (×2): qty 0.2

## 2020-04-02 MED ORDER — ENOXAPARIN SODIUM 80 MG/0.8ML ~~LOC~~ SOLN
70.0000 mg | SUBCUTANEOUS | Status: DC
  Administered 2020-04-02 – 2020-04-03 (×2): 70 mg via SUBCUTANEOUS
  Filled 2020-04-02 (×2): qty 0.8

## 2020-04-02 MED ORDER — POTASSIUM CHLORIDE CRYS ER 20 MEQ PO TBCR
40.0000 meq | EXTENDED_RELEASE_TABLET | ORAL | Status: AC
  Administered 2020-04-02 (×2): 40 meq via ORAL
  Filled 2020-04-02 (×2): qty 2

## 2020-04-02 MED ORDER — HYDROXYZINE HCL 25 MG PO TABS: 25.0000 mg | ORAL_TABLET | Freq: Three times a day (TID) | ORAL | Status: DC | PRN

## 2020-04-02 NOTE — Progress Notes (Signed)
PROGRESS NOTE    Jose Burgess  MBT:[id number] DOB: 10-Aug-1985 DOA: 04/01/2020 PCP: Claiborne Rigg, NP    Chief Complaint  Patient presents with  . Urinary Frequency    Brief Narrative:  Patient 35 year old gentleman history of anxiety, depression, class III obesity, previous elevated blood pressure readings not formally diagnosed as hypertension presented to the ED with 1.5 weeks of polyuria and polydipsia.  Patient denies any chest pain, no shortness of breath, no abdominal pain, no lower extremity edema. Patient seen in the ED, noted to have a blood glucose level of 751, urinalysis with glycosuria, ketonuria, anion gap of 14, creatinine of 1.33.  Patient placed on the glucose stabilizer with improvement with blood sugar levels as well as IV fluids.  Blood glucose levels improved patient transitioned early this morning to subcutaneous Lantus, new coverage NovoLog, sliding scale insulin.   Assessment & Plan:   Principal Problem:   Diabetes mellitus with nonketotic hyperosmolarity (HCC) Active Problems:   Class 3 severe obesity due to excess calories with body mass index (BMI) of 40.0 to 44.9 in adult [facility])   Anxiety with depression   Hypertension   Hypokalemia  1 newly diagnosed diabetes mellitus type 2/diabetes mellitus with nonketotic hyper osmolar hyperglycemia Patient presented with polyuria, polydipsia, noted to have a blood glucose level of 751.  Urine with glycosuria, 20 of ketones.  Urinalysis nitrite negative leukocytes negative.  Chest x-ray done this morning negative for any acute infiltrates.  Patient with no chest pain.  EKG not done.  Check a EKG.  Hemoglobin A1c 11.8.  Patient was on the glucose stabilizer as well as IV fluids.  Blood glucose this morning at 147.  Patient has been transitioned to Lantus 20 units daily, NovoLog 6 units 3 times daily with meals, sliding scale insulin.  Placed on a carb modified diet.  Will likely need insulin on discharge.  Diabetic  coordinator consulted.  2.  Hypokalemia Magnesium level at 2.0.  K. Dur 40 mEq p.o. every 4 hours x2 doses.  3.  Hypertension Continue lisinopril.  Outpatient follow-up.  4.  Depression/anxiety Patient with no SI or HI thoughts.  Place on hydroxyzine as needed.  Outpatient follow-up with PCP.  5.  Class III severe obesity due to excess calories with BMI of 40-44.9 Needs substantial lifestyle modifications.   -Diabetes coordinator consulted for additional DM education. -Outpatient follow-up with PCP.   DVT prophylaxis: Lovenox Code Status: Full Family Communication: Updated patient and girlfriend at bedside. Disposition:   Status is: Observation    Dispo: The patient is from: Home              Anticipated d/c is to: Home hopefully tomorrow              Patient currently being transitioned to subcutaneous insulin.  Not stable for discharge.   Difficult to place patient no       Consultants:   None  Procedures:   Chest x-ray 04/02/2020    Antimicrobials:   None   Subjective: Patient states feeling better.  No chest pain.  No shortness of breath.  No abdominal pain.  Polyuria polydipsia improved.  Girlfriend at bedside.  Objective: Vitals:   04/02/20 0500 04/02/20 0600 04/02/20 0725 04/02/20 0800  BP: 131/77 131/60 (!) 144/72   Pulse: 61 62 73 79  Resp: 15 (!) 24 (!) 26 (!) 25  Temp:    98 F (36.7 C)  TempSrc:    Oral  SpO2: 96% 98%  97% 100%  Weight:      Height:        Intake/Output Summary (Last 24 hours) at 04/02/2020 1005 Last data filed at 04/02/2020 0700 Gross per 24 hour  Intake 2183.42 ml  Output -  Net 2183.42 ml   Filed Weights   04/01/20 2012  Weight: 136.1 kg    Examination:  General exam: Appears calm and comfortable  Respiratory system: Clear to auscultation. Respiratory effort normal. Cardiovascular system: S1 & S2 heard, RRR. No JVD, murmurs, rubs, gallops or clicks. No pedal edema. Gastrointestinal system: Abdomen is  nondistended, soft and nontender. No organomegaly or masses felt. Normal bowel sounds heard. Central nervous system: Alert and oriented. No focal neurological deficits. Extremities: Symmetric 5 x 5 power. Skin: No rashes, lesions or ulcers Psychiatry: Judgement and insight appear normal. Mood & affect appropriate.     Data Reviewed: I have personally reviewed following labs and imaging studies  CBC: Recent Labs  Lab 04/01/20 2029 04/02/20 0818  WBC 10.1 8.3  NEUTROABS 5.7 4.7  HGB 13.2 12.7*  HCT 38.7* 37.3*  MCV 90.0 91.2  PLT 296 270    Basic Metabolic Panel: Recent Labs  Lab 04/01/20 2029 04/02/20 0005 04/02/20 0239 04/02/20 0818  NA 128* 137 139 138  K 4.7 3.1* 3.0* 3.2*  CL 94* 105 106 105  CO2 22 21* 22 24  GLUCOSE 751* 291* 195* 174*  BUN 14 14 13 10   CREATININE 1.33* 1.04 0.91 0.97  CALCIUM 9.4 9.1 9.1 8.9  MG  --   --  2.0  --   PHOS  --   --  3.6  --     GFR: Estimated Creatinine Clearance: 150.4 mL/min (by C-G formula based on SCr of 0.97 mg/dL).  Liver Function Tests: Recent Labs  Lab 04/01/20 2029  AST 37  ALT 38  ALKPHOS 86  BILITOT 1.8*  PROT 7.3  ALBUMIN 4.2    CBG: Recent Labs  Lab 04/02/20 0416 04/02/20 0515 04/02/20 0616 04/02/20 0727 04/02/20 0844  GLUCAP 191* 158* 141* 187* 147*     Recent Results (from the past 240 hour(s))  MRSA PCR Screening     Status: None   Collection Time: 04/02/20 12:42 AM   Specimen: Nasopharyngeal  Result Value Ref Range Status   MRSA by PCR NEGATIVE NEGATIVE Final    Comment:        The GeneXpert MRSA Assay (FDA approved for NASAL specimens only), is one component of a comprehensive MRSA colonization surveillance program. It is not intended to diagnose MRSA infection nor to guide or monitor treatment for MRSA infections. Performed at [facility], [address], [place], [place]          Radiology Studies: DG CHEST PORT 1 VIEW  Result Date:  04/02/2020 CLINICAL DATA:  Hyper glycemia EXAM: PORTABLE CHEST 1 VIEW COMPARISON:  06/08/2019 FINDINGS: Heart size upper normal. Vascularity normal. Lungs clear without infiltrate or effusion. No skeletal abnormality. IMPRESSION: No active disease. Electronically Signed   By: 06/10/2019 M.D.   On: 04/02/2020 08:17        Scheduled Meds: . Chlorhexidine Gluconate Cloth  6 each Topical Daily  . enoxaparin (LOVENOX) injection  40 mg Subcutaneous Q24H  . insulin aspart  0-9 Units Subcutaneous TID WC  . insulin aspart  6 Units Subcutaneous TID WC  . insulin glargine  20 Units Subcutaneous Daily  . lisinopril  5 mg Oral Daily  . pantoprazole  40  mg Oral Daily  . potassium chloride  40 mEq Oral Q4H   Continuous Infusions: . dextrose 5% lactated ringers 125 mL/hr at 04/02/20 0700  . insulin 2.2 mL/hr at 04/02/20 0700  . lactated ringers Stopped (04/02/20 0115)     LOS: 0 days    Time spent: 40 minutes    Ramiro Harvest, MD [facility]   To contact the attending provider between 7A-7P or the covering provider during after hours 7P-7A, please log into the web site [email or web address] and access using universal [place] password for that web site. If you do not have the password, please call the hospital operator.  04/02/2020, 10:05 AM

## 2020-04-02 NOTE — Plan of Care (Signed)
Patient admitted to room 1229 for hyperosmolar hyperglycemia.    Problem: Education: Goal: Ability to describe self-care measures that may prevent or decrease complications (Diabetes Survival Skills Education) will improve Outcome: Progressing Goal: Individualized Educational Video(s) Outcome: Progressing   Problem: Coping: Goal: Ability to adjust to condition or change in health will improve Outcome: Progressing   Problem: Fluid Volume: Goal: Ability to maintain a balanced intake and output will improve Outcome: Progressing   Problem: Health Behavior/Discharge Planning: Goal: Ability to identify and utilize available resources and services will improve Outcome: Progressing Goal: Ability to manage health-related needs will improve Outcome: Progressing   Problem: Metabolic: Goal: Ability to maintain appropriate glucose levels will improve Outcome: Progressing   Problem: Nutritional: Goal: Maintenance of adequate nutrition will improve Outcome: Progressing Goal: Progress toward achieving an optimal weight will improve Outcome: Progressing   Problem: Skin Integrity: Goal: Risk for impaired skin integrity will decrease Outcome: Progressing   Problem: Tissue Perfusion: Goal: Adequacy of tissue perfusion will improve Outcome: Progressing

## 2020-04-02 NOTE — Progress Notes (Addendum)
Inpatient Diabetes Program Recommendations  AACE/ADA: New Consensus Statement on Inpatient Glycemic Control (2015)  Target Ranges:  Prepandial:   less than 140 mg/dL      Peak postprandial:   less than 180 mg/dL (1-2 hours)      Critically ill patients:  140 - 180 mg/dL   Lab Results  Component Value Date   GLUCAP 147 (H) 04/02/2020   HGBA1C 11.8 (H) 04/01/2020    Review of Glycemic Control  Diabetes history: No Prior hx DM Outpatient Diabetes medications: None Current orders for Inpatient glycemic control: Lantus 20 units qd + Novolog 6 units tid + Novolog 0-9 units tid  Inpatient Diabetes Program Recommendations:   -Add Novolog 0-5 units hs correction  Ordered Living Well With Diabetes Booklet, starter kit with pen needles, dietician consult, transition of care consult, and nursing education ongoing during hospitalization. Will plan to speak with patient today.  Spoke with pt @ bedside about new diagnosis. Discussed A1C results with them and explained what an A1C is, basic pathophysiology of DM Type 2, basic home care, basic diabetes diet nutrition principles, importance of checking CBGs and maintaining good CBG control to prevent long-term and short-term complications. Reviewed signs and symptoms of hyperglycemia and hypoglycemia and how to treat hypoglycemia at home. Also reviewed blood sugar goals at home.  RNs to provide ongoing basic DM education at bedside with this patient.  Patient has appointment set for [facility] for March 20th. Reviewed with pt. Need to keep appointments and take list of CBGs with him to appointments.  Educated patient on insulin pen use at home. Reviewed contents of insulin flexpen starter kit. Reviewed all steps if insulin pen including attachment of needle, 2-unit air shot, dialing up dose, giving injection, removing needle, disposal of sharps, storage of unused insulin, disposal of insulin etc. Patient able to provide successful  return demonstration. Also reviewed troubleshooting with insulin pen. MD to give patient Rxs for insulin pens and insulin pen needles.  Thank you, Nani Gasser. Alexah Kivett, RN, MSN, CDE  Diabetes Coordinator Inpatient Glycemic Control Team Team Pager [phone] (8am-5pm) 04/02/2020 11:10 AM

## 2020-04-02 NOTE — Progress Notes (Signed)
Lantus given at 0540. Will stop Insulin drip and D5LR at 0740.

## 2020-04-02 NOTE — TOC Initial Note (Signed)
Transition of Care [facility]) - Initial/Assessment Note    Patient Details  Name: Jose Burgess MRN: [id number] Date of Birth: 18-Sep-1985  Transition of [facility] CM/SW Contact:    Golda Acre, RN Phone Number: 04/02/2020, 8:02 AM  Clinical Narrative:                 35 y.o. male with medical history significant of anxiety, depression, class III obesity, previous elevated blood pressure readings not formally diagnosed hypertension who is coming to the emergency department with week and a half history of polyuria and polydipsia.  Both of his parents are diabetic.  He denies blurred vision, headache, sore throat, rhinorrhea, wheezing or hemoptysis.  No chest pain, palpitations, diaphoresis, PND, orthopnea or pitting edema of the lower extremities.  Denies abdominal pain, nausea, vomiting, diarrhea, constipation, melena or hematochezia.  No dysuria or hematuria.  No depressive symptoms at this time, but feels a little anxious due to being diagnosed with diabetes.  ED Course: Initial vital signs were temperature 98.5 F, pulse 97, respirations 17, blood pressure 146/100 mmHg O2 sat 99% on room air.  The patient was given IV fluids and started on a continuous insulin infusion.  He also received potassium replacement.  Lab work: His urinalysis showed glucosuria more than 500 and ketonuria 20 mg/dL.  CBC was unremarkable.  CMP showed a sodium 128, potassium 4.7, chloride 94 and CO2 22 mmol/L.  Anion gap was 14.  Glucose 751, creatinine 1.33 and total bilirubin 1.8 mg/dL.  The rest of the CMP measurements are within normal limits. PLAN: to return to home with self cre, does live in an apt by himself, has family support,will need the diabetic coordinator to see since he is a relativity  new diabetic.  Expected Discharge Plan: Home/Self Care Barriers to Discharge: Continued Medical Work up   Patient Goals and CMS Choice Patient states their goals for this hospitalization and ongoing recovery are::  to go home CMS Medicare.gov Compare Post Acute Care list provided to:: Patient    Expected Discharge Plan and Services Expected Discharge Plan: Home/Self Care   Discharge Planning Services: CM Consult   Living arrangements for the past 2 months: Apartment                                      Prior Living Arrangements/Services Living arrangements for the past 2 months: Apartment Lives with:: Self Patient language and need for interpreter reviewed:: Yes Do you feel safe going back to the place where you live?: Yes      Need for Family Participation in Patient Care: Yes (Comment) Care giver support system in place?: Yes (comment)   Criminal Activity/Legal Involvement Pertinent to Current Situation/Hospitalization: No - Comment as needed  Activities of Daily Living Home Assistive Devices/Equipment: None ADL Screening (condition at time of admission) Patient's cognitive ability adequate to safely complete daily activities?: Yes Is the patient deaf or have difficulty hearing?: No Does the patient have difficulty seeing, even when wearing glasses/contacts?: No Does the patient have difficulty concentrating, remembering, or making decisions?: No Patient able to express need for assistance with ADLs?: Yes Does the patient have difficulty dressing or bathing?: No Independently performs ADLs?: Yes (appropriate for developmental age) Does the patient have difficulty walking or climbing stairs?: No Weakness of Legs: None Weakness of Arms/Hands: None  Permission Sought/Granted  Emotional Assessment Appearance:: Appears stated age Attitude/Demeanor/Rapport: Engaged Affect (typically observed): Calm Orientation: : Oriented to Self,Oriented to Place,Oriented to  Time,Oriented to Situation Alcohol / Substance Use: Not Applicable Psych Involvement: No (comment)  Admission diagnosis:  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 Anxiety with depression 04/01/2020  . Hypertension 04/01/2020  . Class 3 severe obesity due to excess calories with body mass index (BMI) of 40.0 to 44.9 in adult (HCC) 11/25/2017  . Vitiligo 11/25/2017   PCP:  Claiborne Rigg, NP Pharmacy:   [facility] - [place], [place] - [address] [facility]. [address] [place] [address] Phone: [phone] Fax: [phone]     Social Determinants of Health (SDOH) Interventions    Readmission Risk Interventions No flowsheet data found.

## 2020-04-03 DIAGNOSIS — E1100 Type 2 diabetes mellitus with hyperosmolarity without nonketotic hyperglycemic-hyperosmolar coma (NKHHC): Principal | ICD-10-CM

## 2020-04-03 LAB — BASIC METABOLIC PANEL
Anion gap: 10 (ref 5–15)
BUN: 13 mg/dL (ref 6–20)
CO2: 21 mmol/L — ABNORMAL LOW (ref 22–32)
Calcium: 8.7 mg/dL — ABNORMAL LOW (ref 8.9–10.3)
Chloride: 105 mmol/L (ref 98–111)
Creatinine, Ser: 0.93 mg/dL (ref 0.61–1.24)
GFR, Estimated: >60 mL/min (ref >60)
Glucose, Bld: 318 mg/dL — ABNORMAL HIGH (ref 70–99)
Potassium: 3.9 mmol/L (ref 3.5–5.1)
Sodium: 136 mmol/L (ref 135–145)

## 2020-04-03 LAB — GLUCOSE, CAPILLARY
Glucose-Capillary: 283 mg/dL — ABNORMAL HIGH (ref 70–99)
Glucose-Capillary: 265 mg/dL — ABNORMAL HIGH (ref 70–99)
Glucose-Capillary: 258 mg/dL — ABNORMAL HIGH (ref 70–99)
Glucose-Capillary: 244 mg/dL — ABNORMAL HIGH (ref 70–99)

## 2020-04-03 MED ORDER — INSULIN ASPART PROT & ASPART (70-30 MIX) 100 UNIT/ML ~~LOC~~ SUSP
20.0000 [IU] | Freq: Two times a day (BID) | SUBCUTANEOUS | Status: DC
  Filled 2020-04-03: qty 10

## 2020-04-03 MED ORDER — INSULIN GLARGINE 100 UNIT/ML ~~LOC~~ SOLN
6.0000 [IU] | Freq: Once | SUBCUTANEOUS | Status: AC
  Administered 2020-04-03: 6 [IU] via SUBCUTANEOUS
  Filled 2020-04-03: qty 0.06

## 2020-04-03 MED ORDER — INSULIN GLARGINE 100 UNIT/ML ~~LOC~~ SOLN: 26.0000 [IU] | Freq: Every day | SUBCUTANEOUS | Status: DC

## 2020-04-03 MED ORDER — INSULIN ASPART 100 UNIT/ML ~~LOC~~ SOLN: 0.0000 [IU] | Freq: Three times a day (TID) | SUBCUTANEOUS | Status: DC

## 2020-04-03 MED ORDER — INSULIN ASPART 100 UNIT/ML ~~LOC~~ SOLN
8.0000 [IU] | Freq: Three times a day (TID) | SUBCUTANEOUS | Status: AC
  Administered 2020-04-03 (×2): 8 [IU] via SUBCUTANEOUS

## 2020-04-03 MED ORDER — INSULIN ASPART 100 UNIT/ML ~~LOC~~ SOLN
0.0000 [IU] | Freq: Three times a day (TID) | SUBCUTANEOUS | Status: DC
  Administered 2020-04-04: 4 [IU] via SUBCUTANEOUS
  Administered 2020-04-04: 7 [IU] via SUBCUTANEOUS

## 2020-04-03 NOTE — TOC Benefit Eligibility Note (Signed)
Transition of Care [facility]) Benefit Eligibility Note    Patient Details  Name: Jose Burgess MRN: [id number] Date of Birth: 1986-01-20   Medication/Dose: Cira Servant  FLEXPEN  CO-PAY- $40.00    NOVOLIN RELION 70/30  CO-PAY- $40.00  Covered?: Yes  Tier: 3 Drug  Prescription Coverage Preferred Pharmacy: [facility] Sanchez with Person/Company/Phone Number:: [employer]  @ [employer]  #  [phone]  Co-Pay: $40.00  FOR EACH PRESCRIPTION  Prior Approval: No  Deductible: Met  Additional Notes: LANTUS SOLOSTER PEN  NOT COVER ,NON-FORMULARY CANNOT DO PRIOR APPROVAL    Memory Argue Phone Number: 04/03/2020, 12:19 PM

## 2020-04-03 NOTE — Progress Notes (Addendum)
Inpatient Diabetes Program Recommendations  AACE/ADA: New Consensus Statement on Inpatient Glycemic Control (2015)  Target Ranges:  Prepandial:   less than 140 mg/dL      Peak postprandial:   less than 180 mg/dL (1-2 hours)      Critically ill patients:  140 - 180 mg/dL   Lab Results  Component Value Date   GLUCAP 283 (H) 04/03/2020   HGBA1C 11.8 (H) 04/01/2020    Review of Glycemic Control Results for Jose Burgess, Jose Burgess (MRN [id number]) as of 04/03/2020 08:44  Ref. Range 04/02/2020 08:44 04/02/2020 12:23 04/02/2020 16:16 04/02/2020 21:58 04/03/2020 07:39  Glucose-Capillary Latest Ref Range: 70 - 99 mg/dL 017 (H) 494 (H) 496 (H) 262 (H) 283 (H)   Diabetes history: No Prior hx DM Outpatient Diabetes medications: None Current orders for Inpatient glycemic control: Lantus 20 units qd + Novolog 6 units tid + Novolog 0-9 units tid  Inpatient Diabetes Program Recommendations:   -Increase Lantus to 26 units qd (0.2 units/kg x 130.9 kg) -Increase Novolog meal coverage to 8 units tid if eats 50%  Discharge needs: -Lantus solostar Insulin pen [id number] Rubbie Battiest Flexpen [id number] -CBG Meter & supplies [id number] -Libre continuous glucose sensor [id number]  Transition of care checking on patient's current insurance situation. If pt. Goes home on Novolin Relion 70/30 insulin 20 units bid would = approximately 28 units basal + 12 units meal coverage. Secure chat with information sent to Dr. Jonathon Bellows and case manager Sandford Craze.  Spoke with patient @ bedside. Due to Lantus not being covered by insurance, patient prefers Novolin Relion [employer] 70/30 insulin bid. Placed Libre continuous glucose sensor on back of patient's left upper arm and assisted patient to download app on his iphone to check @ home. Answered nutrition and range of normal blood glucose fasting and ac meals with patient.  Thank you, Jose Burgess. Deniqua Perry, RN, MSN, CDE  Diabetes Coordinator Inpatient Glycemic Control Team Team Pager [phone]  (8am-5pm) 04/03/2020 9:42 AM

## 2020-04-03 NOTE — Progress Notes (Signed)
Nutrition Note  RD consulted for nutrition education regarding diabetes.   Lab Results  Component Value Date   HGBA1C 11.8 (H) 04/01/2020    RD provided "Carbohydrate Counting for People with Diabetes" handout from the Academy of Nutrition and Dietetics. Discussed different food groups and their effects on blood sugar, emphasizing carbohydrate-containing foods. Provided list of carbohydrates and recommended serving sizes of common foods.  Discussed importance of controlled and consistent carbohydrate intake throughout the day. Provided examples of ways to balance meals/snacks and encouraged intake of high-fiber, whole grain complex carbohydrates. Teach back method used.  Expect good compliance.  Body mass index is 41.41 kg/m. Pt meets criteria for morbid obesity based on current BMI.  Current diet order is CHO modified, patient is consuming approximately 100% of meals at this time. Labs and medications reviewed. No further nutrition interventions warranted at this time.  If additional nutrition issues arise, please re-consult RD.  Tilda Franco, MS, RD, LDN Inpatient Clinical Dietitian Contact information available via Amion

## 2020-04-03 NOTE — Progress Notes (Signed)
PROGRESS NOTE    Jose Burgess  HUD:[id number] DOB: 1985-04-28 DOA: 04/01/2020 PCP: Gildardo Pounds, NP   Chief Complaint  Patient presents with  . Urinary Frequency  Brief Narrative:  35 year old male with anxiety/depression, morbid obesity previous elevated blood pressure admitted with polyuria polydipsia and found to have new onset diabetes mellitus, was treated for nonketotic hyperosmolar hyperglycemia.  Subjective: Seen this morning alert awake oriented resting comfortably no new complaints   Assessment & Plan:  New onset diabetes mellitus type 2 Nonketotic hyperosmolar hyperglycemia-resolved New diagnosis of T2DM patient has been transitioned to Lantus and premeal insulin.  Patient does not have coverage for Lantus so we will switch to Novolin 70/30 in a.m. keep on sliding scale, diabetic diet.  Discussed extensively about long-term and short-term complication of diabetes, for close follow-up requirement for his diabetes with off pulmonology visit podiatry visit, seen by diabetic monitor.  A1c uncontrolled 9.8.  Blood sugar fairly stable today. Recent Labs  Lab 04/02/20 1223 04/02/20 1616 04/02/20 2158 04/03/20 0739 04/03/20 1148  GLUCAP 233* 265* 262* 283* 265*   Morbid obesity with BMI 41: Will benefit with outpatient weight loss follow-up with PCP, sleep apnea evaluation as outpatient.  Hypertension blood pressure stable on lisinopril  Anxiety/depression no SI: Patient follow-up  Diet Order            Diet Carb Modified Fluid consistency: Thin; Room service appropriate? Yes  Diet effective now               Patient's Body mass index is 41.41 kg/m.  DVT prophylaxis: lovenox Code Status:   Code Status: Full Code  Family Communication: plan of care discussed with patient at bedside.  Status is: Inpatient  Remains inpatient appropriate because:Inpatient level of care appropriate due to severity of illness   Dispo: The patient is from: Home               Anticipated d/c is to: Home tomorrow if sugar remains controlled              Patient currently is not medically stable to d/c.   Difficult to place patient No       Unresulted Labs (From admission, onward)         None      Medications reviewed:  Scheduled Meds: . Chlorhexidine Gluconate Cloth  6 each Topical Daily  . enoxaparin (LOVENOX) injection  70 mg Subcutaneous Q24H  . insulin aspart  0-9 Units Subcutaneous TID WC  . insulin aspart  8 Units Subcutaneous TID WC  . [START ON 04/04/2020] insulin glargine  26 Units Subcutaneous Daily  . insulin starter kit- pen needles  1 kit Other Once  . lisinopril  5 mg Oral Daily  . living well with diabetes book   Does not apply Once  . pantoprazole  40 mg Oral Daily   Continuous Infusions:  Consultants:see note  Procedures:see note  Antimicrobials: Anti-infectives (From admission, onward)   None     Culture/Microbiology No results found for: SDES, SPECREQUEST, CULT, REPTSTATUS  Other culture-see note  Objective: Vitals: Today's Vitals   04/03/20 0434 04/03/20 0936 04/03/20 1150 04/03/20 1200  BP: (!) 124/55 (!) 142/82 (!) 145/80   Pulse: 73 87 85   Resp:  18 18   Temp: 98 F (36.7 C) 98.5 F (36.9 C) 98.3 F (36.8 C)   TempSrc: Oral Oral Oral   SpO2: 99% 97% 98%   Weight:      Height:  PainSc:    0-No pain    Intake/Output Summary (Last 24 hours) at 04/03/2020 1335 Last data filed at 04/03/2020 0900 Gross per 24 hour  Intake 240 ml  Output --  Net 240 ml   Filed Weights   04/01/20 2012 04/02/20 1515  Weight: 136.1 kg 130.9 kg   Weight change: -5.179 kg  Intake/Output from previous day: No intake/output data recorded. Intake/Output this shift: Total I/O In: 240 [P.O.:240] Out: -  Filed Weights   04/01/20 2012 04/02/20 1515  Weight: 136.1 kg 130.9 kg    Examination: General exam: AAOx3 ,NAD, weak appearing. HEENT:Oral mucosa moist, Ear/Nose WNL grossly,dentition normal. Respiratory system:  bilaterally diminished,no use of accessory muscle, non tender. Cardiovascular system: S1 & S2 +, regular, No JVD. Gastrointestinal system: Abdomen soft, NT,ND, BS+. Nervous System:Alert, awake, moving extremities and grossly nonfocal Extremities: No edema, distal peripheral pulses palpable.  Skin: No rashes,no icterus. MSK: Normal muscle bulk,tone, power  Data Reviewed: I have personally reviewed following labs and imaging studies CBC: Recent Labs  Lab 04/01/20 2029 04/02/20 0818  WBC 10.1 8.3  NEUTROABS 5.7 4.7  HGB 13.2 12.7*  HCT 38.7* 37.3*  MCV 90.0 91.2  PLT 296 202   Basic Metabolic Panel: Recent Labs  Lab 04/01/20 2029 04/02/20 0005 04/02/20 0239 04/02/20 0818 04/03/20 0524  NA 128* 137 139 138 136  K 4.7 3.1* 3.0* 3.2* 3.9  CL 94* 105 106 105 105  CO2 22 21* 22 24 21*  GLUCOSE 751* 291* 195* 174* 318*  BUN _0 CREATININE 1.33* 1.04 0.91 0.97 0.93  CALCIUM 9.4 9.1 9.1 8.9 8.7*  MG  --   --  2.0  --   --   PHOS  --   --  3.6  --   --    GFR: Estimated Creatinine Clearance: 153.7 mL/min (by C-G formula based on SCr of 0.93 mg/dL). Liver Function Tests: Recent Labs  Lab 04/01/20 2029  AST 37  ALT 38  ALKPHOS 86  BILITOT 1.8*  PROT 7.3  ALBUMIN 4.2   No results for input(s): LIPASE, AMYLASE in the last 168 hours. No results for input(s): AMMONIA in the last 168 hours. Coagulation Profile: No results for input(s): INR, PROTIME in the last 168 hours. Cardiac Enzymes: No results for input(s): CKTOTAL, CKMB, CKMBINDEX, TROPONINI in the last 168 hours. BNP (last 3 results) No results for input(s): PROBNP in the last 8760 hours. HbA1C: Recent Labs    04/01/20 2312  HGBA1C 11.8*   CBG: Recent Labs  Lab 04/02/20 1223 04/02/20 1616 04/02/20 2158 04/03/20 0739 04/03/20 1148  GLUCAP 233* 265* 262* 283* 265*   Lipid Profile: No results for input(s): CHOL, HDL, LDLCALC, TRIG, CHOLHDL, LDLDIRECT in the last 72 hours. Thyroid Function  Tests: No results for input(s): TSH, T4TOTAL, FREET4, T3FREE, THYROIDAB in the last 72 hours. Anemia Panel: No results for input(s): VITAMINB12, FOLATE, FERRITIN, TIBC, IRON, RETICCTPCT in the last 72 hours. Sepsis Labs: No results for input(s): PROCALCITON, LATICACIDVEN in the last 168 hours.  Recent Results (from the past 240 hour(s))  SARS CORONAVIRUS 2 (TAT 6-24 HRS) Nasopharyngeal Nasopharyngeal Swab     Status: None   Collection Time: 04/01/20 11:18 PM   Specimen: Nasopharyngeal Swab  Result Value Ref Range Status   SARS Coronavirus 2 NEGATIVE NEGATIVE Final    Comment: (NOTE) SARS-CoV-2 target nucleic acids are NOT DETECTED.  The SARS-CoV-2 RNA is generally detectable in upper and lower respiratory specimens during the  acute phase of infection. Negative results do not preclude SARS-CoV-2 infection, do not rule out co-infections with other pathogens, and should not be used as the sole basis for treatment or other patient management decisions. Negative results must be combined with clinical observations, patient history, and epidemiological information. The expected result is Negative.  Fact Sheet for Patients: SugarRoll.be  Fact Sheet for Healthcare Providers: https://www.woods-mathews.com/  This test is not yet approved or cleared by the [place] FDA and  has been authorized for detection and/or diagnosis of SARS-CoV-2 by FDA under an Emergency Use Authorization (EUA). This EUA will remain  in effect (meaning this test can be used) for the duration of the COVID-19 declaration under Se ction 564(b)(1) of the Act, 21 U.S.C. section 360bbb-3(b)(1), unless the authorization is terminated or revoked sooner.  Performed at [facility], [place] [address], [place], [place] [address]   MRSA PCR Screening     Status: None   Collection Time: 04/02/20 12:42 AM   Specimen: Nasopharyngeal  Result Value Ref Range Status   MRSA by  PCR NEGATIVE NEGATIVE Final    Comment:        The GeneXpert MRSA Assay (FDA approved for NASAL specimens only), is one component of a comprehensive MRSA colonization surveillance program. It is not intended to diagnose MRSA infection nor to guide or monitor treatment for MRSA infections. Performed at [facility], [place] [address], [place], [place] [address]      Radiology Studies: DG CHEST PORT 1 VIEW  Result Date: 04/02/2020 CLINICAL DATA:  Hyper glycemia EXAM: PORTABLE CHEST 1 VIEW COMPARISON:  06/08/2019 FINDINGS: Heart size upper normal. Vascularity normal. Lungs clear without infiltrate or effusion. No skeletal abnormality. IMPRESSION: No active disease. Electronically Signed   By: Franchot Gallo M.D.   On: 04/02/2020 08:17     LOS: 1 day   Antonieta Pert, MD [facility]  04/03/2020, 1:35 PM

## 2020-04-04 ENCOUNTER — Other Ambulatory Visit: Payer: Self-pay | Admitting: Internal Medicine

## 2020-04-04 ENCOUNTER — Other Ambulatory Visit (HOSPITAL_COMMUNITY): Payer: Self-pay | Admitting: Internal Medicine

## 2020-04-04 LAB — BASIC METABOLIC PANEL
Anion gap: 10 (ref 5–15)
BUN: 14 mg/dL (ref 6–20)
CO2: 23 mmol/L (ref 22–32)
Calcium: 9.1 mg/dL (ref 8.9–10.3)
Chloride: 104 mmol/L (ref 98–111)
Creatinine, Ser: 0.86 mg/dL (ref 0.61–1.24)
GFR, Estimated: >60 mL/min (ref >60)
Glucose, Bld: 305 mg/dL — ABNORMAL HIGH (ref 70–99)
Potassium: 3.8 mmol/L (ref 3.5–5.1)
Sodium: 137 mmol/L (ref 135–145)

## 2020-04-04 LAB — GLUCOSE, CAPILLARY
Glucose-Capillary: 301 mg/dL — ABNORMAL HIGH (ref 70–99)
Glucose-Capillary: 249 mg/dL — ABNORMAL HIGH (ref 70–99)

## 2020-04-04 MED ORDER — INSULIN ASPART PROT & ASPART (70-30 MIX) 100 UNIT/ML ~~LOC~~ SUSP
25.0000 [IU] | Freq: Two times a day (BID) | SUBCUTANEOUS | Status: DC
  Administered 2020-04-04: 25 [IU] via SUBCUTANEOUS
  Filled 2020-04-04: qty 10

## 2020-04-04 MED ORDER — LISINOPRIL 5 MG PO TABS: 5.0000 mg | ORAL_TABLET | Freq: Every day | ORAL | 0 refills | Status: DC

## 2020-04-04 MED ORDER — NOVOLIN 70/30 FLEXPEN RELION (70-30) 100 UNIT/ML ~~LOC~~ SUPN: 25.0000 [IU] | PEN_INJECTOR | Freq: Two times a day (BID) | SUBCUTANEOUS | 1 refills | Status: DC

## 2020-04-04 MED ORDER — INSULIN PEN NEEDLE 31G X 5 MM MISC: 1.0000 | Freq: Every day | 0 refills | Status: DC

## 2020-04-04 MED ORDER — LOPERAMIDE HCL 2 MG PO CAPS
2.0000 mg | ORAL_CAPSULE | ORAL | Status: DC | PRN
  Administered 2020-04-04: 2 mg via ORAL
  Filled 2020-04-04: qty 1

## 2020-04-04 MED FILL — LISINOPRIL 5 MG TABS: 5 | 30 days supply | Qty: 30 | Fill #0

## 2020-04-04 NOTE — Discharge Summary (Signed)
Physician Discharge Summary  [facility] ZOX:[id number] DOB: 04/02/1985 DOA: 04/01/2020  PCP: Claiborne RiggFleming, Zelda W, NP  Admit date: 04/01/2020 Discharge date: 04/04/2020  Admitted From: home Disposition:  home  Recommendations for Outpatient Follow-up:  1. Follow up with PCP in 1-2 week  Home Health:no  Equipment/Devices: none  Discharge Condition: Stable Code Status:   Code Status: Full Code Diet recommendation:  Diet Order            Diet - low sodium heart healthy           Diet Carb Modified Fluid consistency: Thin; Room service appropriate? Yes  Diet effective now                  Brief/Interim Summary: 35 year old male with anxiety/depression, morbid obesity previous elevated blood pressure admitted with polyuria polydipsia and found to have new onset diabetes mellitus, was treated for nonketotic hyperosmolar hyperglycemia.  Patient has subsequently transitioned to subcu insulin blood sugar has stabilized at this time blood sugar much better in 240s he was again transitioned to 70/30 insulin because of the cost insulin cc.  He may need to go up further on his insulin he will check his sugar 4 times a day and follow-up with PCP and maintain a logbook.  Discharge Diagnoses:  New onset diabetes mellitus type 2 Nonketotic hyperosmolar hyperglycemia-resolved New diagnosis of T2DM patient has been transitioned to Lantus and premeal insulin.  Patient does not have coverage for Lantus.  Transition to 730 FlexPen prescription to [facility] dose increased this morning we will follow up with PCP soon and maintain a logbook to adjust her insulin regimen.  We discussed about hypoglycemia hyperglycemia healthy recognize them and how to treat them.  Discussed extensively about long-term and short-term complication of diabetes, for close follow-up requirement for his diabetes with off pulmonology visit podiatry visit, seen by diabetic monitor.  A1c uncontrolled 11.8  Morbid obesity with BMI 41:  Will benefit with outpatient weight loss follow-up with PCP, sleep apnea evaluation as outpatient.  Hypertension blood pressure stable on lisinopril  Anxiety/depression no SI: Patient follow-up  Consults: Dm co-ordinater  Subjective: Alert awake oriented resting comfortably would like to go home today.  Blood sugar improving although on higher side in the morning and 300  Discharge Exam: Vitals:   04/03/20 2109 04/04/20 0614  BP: 120/74 117/67  Pulse: 94 83  Resp: 18 14  Temp: 97.6 F (36.4 C) 97.8 F (36.6 C)  SpO2: 98% 100%   General: Pt is alert, awake, not in acute distress Cardiovascular: RRR, S1/S2 +, no rubs, no gallops Respiratory: CTA bilaterally, no wheezing, no rhonchi Abdominal: Soft, NT, ND, bowel sounds + Extremities: no edema, no cyanosis  Discharge Instructions  Discharge Instructions    Diet - low sodium heart healthy   Complete by: As directed    Discharge instructions   Complete by: As directed    Check blood sugar 3 times a day and bedtime at home. If blood sugar running above 200 less than 70 please call your MD to adjust insulin. If blood sugars running less 100 do not use insulin and call MD. If you noticed signs and symptoms of hypoglycemia or low blood sugar like jitteriness, confusion, thirst, tremor, sweating- Check blood sugar, drink sugary drink/biscuits/sweets to increase sugar level and call MD or return to ER.   Increase activity slowly   Complete by: As directed      Allergies as of 04/04/2020   No Known Allergies  Medication List    TAKE these medications   Insulin Pen Needle 31G X 5 MM Misc 1 each by Does not apply route daily at 6 (six) AM.   lisinopril 5 MG tablet Commonly known as: ZESTRIL Take 1 tablet (5 mg total) by mouth daily. Start taking on: April 05, 2020   NovoLIN 70/30 FlexPen Relion (70-30) 100 UNIT/ML KwikPen Generic drug: insulin isophane & regular human Inject 25 Units into the skin 2 (two) times  daily before lunch and supper.       Follow-up Information    Claiborne Rigg, NP Follow up in 1 week(s).   Specialty: Nurse Practitioner Contact information: [address] [place] [address] [phone]              No Known Allergies  The results of significant diagnostics from this hospitalization (including imaging, microbiology, ancillary and laboratory) are listed below for reference.    Microbiology: Recent Results (from the past 240 hour(s))  SARS CORONAVIRUS 2 (TAT 6-24 HRS) Nasopharyngeal Nasopharyngeal Swab     Status: None   Collection Time: 04/01/20 11:18 PM   Specimen: Nasopharyngeal Swab  Result Value Ref Range Status   SARS Coronavirus 2 NEGATIVE NEGATIVE Final    Comment: (NOTE) SARS-CoV-2 target nucleic acids are NOT DETECTED.  The SARS-CoV-2 RNA is generally detectable in upper and lower respiratory specimens during the acute phase of infection. Negative results do not preclude SARS-CoV-2 infection, do not rule out co-infections with other pathogens, and should not be used as the sole basis for treatment or other patient management decisions. Negative results must be combined with clinical observations, patient history, and epidemiological information. The expected result is Negative.  Fact Sheet for Patients: HairSlick.no  Fact Sheet for Healthcare Providers: quierodirigir.com  This test is not yet approved or cleared by the [place] FDA and  has been authorized for detection and/or diagnosis of SARS-CoV-2 by FDA under an Emergency Use Authorization (EUA). This EUA will remain  in effect (meaning this test can be used) for the duration of the COVID-19 declaration under Se ction 564(b)(1) of the Act, 21 U.S.C. section 360bbb-3(b)(1), unless the authorization is terminated or revoked sooner.  Performed at [facility], [address], [place], [place] [address]   MRSA  PCR Screening     Status: None   Collection Time: 04/02/20 12:42 AM   Specimen: Nasopharyngeal  Result Value Ref Range Status   MRSA by PCR NEGATIVE NEGATIVE Final    Comment:        The GeneXpert MRSA Assay (FDA approved for NASAL specimens only), is one component of a comprehensive MRSA colonization surveillance program. It is not intended to diagnose MRSA infection nor to guide or monitor treatment for MRSA infections. Performed at [facility], [address], [place], [place] [address]     Procedures/Studies: DG CHEST PORT 1 VIEW  Result Date: 04/02/2020 CLINICAL DATA:  Hyper glycemia EXAM: PORTABLE CHEST 1 VIEW COMPARISON:  06/08/2019 FINDINGS: Heart size upper normal. Vascularity normal. Lungs clear without infiltrate or effusion. No skeletal abnormality. IMPRESSION: No active disease. Electronically Signed   By: Marlan Palau M.D.   On: 04/02/2020 08:17    Labs: BNP (last 3 results) No results for input(s): BNP in the last 8760 hours. Basic Metabolic Panel: Recent Labs  Lab 04/02/20 0005 04/02/20 0239 04/02/20 0818 04/03/20 0524 04/04/20 0543  NA 137 139 138 136 137  K 3.1* 3.0* 3.2* 3.9 3.8  CL  105 106 105 105 104  CO2 21* 22 24 21* 23  GLUCOSE 291* 195* 174* 318* 305*  BUN 14 13 10 13 14   CREATININE 1.04 0.91 0.97 0.93 0.86  CALCIUM 9.1 9.1 8.9 8.7* 9.1  MG  --  2.0  --   --   --   PHOS  --  3.6  --   --   --    Liver Function Tests: Recent Labs  Lab 04/01/20 2029  AST 37  ALT 38  ALKPHOS 86  BILITOT 1.8*  PROT 7.3  ALBUMIN 4.2   No results for input(s): LIPASE, AMYLASE in the last 168 hours. No results for input(s): AMMONIA in the last 168 hours. CBC: Recent Labs  Lab 04/01/20 2029 04/02/20 0818  WBC 10.1 8.3  NEUTROABS 5.7 4.7  HGB 13.2 12.7*  HCT 38.7* 37.3*  MCV 90.0 91.2  PLT 296 270   Cardiac Enzymes: No results for input(s): CKTOTAL, CKMB, CKMBINDEX, TROPONINI in the last 168 hours. BNP: Invalid input(s):  POCBNP CBG: Recent Labs  Lab 04/03/20 1148 04/03/20 1621 04/03/20 2110 04/04/20 0748 04/04/20 1220  GLUCAP 265* 258* 244* 301* 249*   D-Dimer No results for input(s): DDIMER in the last 72 hours. Hgb A1c Recent Labs    04/01/20 2312  HGBA1C 11.8*   Lipid Profile No results for input(s): CHOL, HDL, LDLCALC, TRIG, CHOLHDL, LDLDIRECT in the last 72 hours. Thyroid function studies No results for input(s): TSH, T4TOTAL, T3FREE, THYROIDAB in the last 72 hours.  Invalid input(s): FREET3 Anemia work up No results for input(s): VITAMINB12, FOLATE, FERRITIN, TIBC, IRON, RETICCTPCT in the last 72 hours. Urinalysis    Component Value Date/Time   COLORURINE COLORLESS (A) 04/01/2020 2024   APPEARANCEUR CLEAR 04/01/2020 2024   LABSPEC 1.028 04/01/2020 2024   PHURINE 5.0 04/01/2020 2024   GLUCOSEU >=500 (A) 04/01/2020 2024   HGBUR NEGATIVE 04/01/2020 2024   BILIRUBINUR NEGATIVE 04/01/2020 2024   KETONESUR 20 (A) 04/01/2020 2024   PROTEINUR NEGATIVE 04/01/2020 2024   NITRITE NEGATIVE 04/01/2020 2024   LEUKOCYTESUR NEGATIVE 04/01/2020 2024   Sepsis Labs Invalid input(s): PROCALCITONIN,  WBC,  LACTICIDVEN Microbiology Recent Results (from the past 240 hour(s))  SARS CORONAVIRUS 2 (TAT 6-24 HRS) Nasopharyngeal Nasopharyngeal Swab     Status: None   Collection Time: 04/01/20 11:18 PM   Specimen: Nasopharyngeal Swab  Result Value Ref Range Status   SARS Coronavirus 2 NEGATIVE NEGATIVE Final    Comment: (NOTE) SARS-CoV-2 target nucleic acids are NOT DETECTED.  The SARS-CoV-2 RNA is generally detectable in upper and lower respiratory specimens during the acute phase of infection. Negative results do not preclude SARS-CoV-2 infection, do not rule out co-infections with other pathogens, and should not be used as the sole basis for treatment or other patient management decisions. Negative results must be combined with clinical observations, patient history, and epidemiological  information. The expected result is Negative.  Fact Sheet for Patients: 06/01/20  Fact Sheet for Healthcare Providers: HairSlick.no  This test is not yet approved or cleared by the [place] FDA and  has been authorized for detection and/or diagnosis of SARS-CoV-2 by FDA under an Emergency Use Authorization (EUA). This EUA will remain  in effect (meaning this test can be used) for the duration of the COVID-19 declaration under Se ction 564(b)(1) of the Act, 21 U.S.C. section 360bbb-3(b)(1), unless the authorization is terminated or revoked sooner.  Performed at [facility], [address], [place], [place]  MRSA PCR Screening     Status: None   Collection Time: 04/02/20 12:42 AM   Specimen: Nasopharyngeal  Result Value Ref Range Status   MRSA by PCR NEGATIVE NEGATIVE Final    Comment:        The GeneXpert MRSA Assay (FDA approved for NASAL specimens only), is one component of a comprehensive MRSA colonization surveillance program. It is not intended to diagnose MRSA infection nor to guide or monitor treatment for MRSA infections. Performed at [facility], [address], [place], [place] [address]      Time coordinating discharge: 25 minutes  SIGNED: Lanae Boast, MD  [facility] 04/04/2020, 1:26 PM  If 7PM-7AM, please contact night-coverage [email or web address]

## 2020-04-05 ENCOUNTER — Telehealth: Payer: Self-pay

## 2020-04-05 ENCOUNTER — Telehealth: Payer: Self-pay | Admitting: *Deleted

## 2020-04-05 NOTE — Telephone Encounter (Signed)
Transition Care Management Follow-up Telephone Call  Date of discharge and from where:[facility] 04/04/2020 How have you been since you were released from the hospital? [facility]  Any questions or concerns? Stated since discharged is been controlling his calorie food intake and drinking plenty of fluids but his BS is still in the 300 .Wanted to know  if continue with the same dose as per discharge instructions  or increase the dose ? Please advice.  Items Reviewed: Did the pt receive and understand the discharge instructions provided? Stated that have the instructions and have no questions.  Medications obtained and verified? Yes obtained and verified  Any new allergies since your discharge? None reported  Do you have support at home? Yes Other (ie: DME, Home Health, etc)       Functional Questionnaire: (I = Independent and D = Dependent) ADL's:  Independent.      Follow up appointments reviewed:   PCP Hospital f/u appt confirmed? NP Bertram [facility] on 04/15/2020@ 1050.  Specialist Hospital f/u appt confirmed? None scheduled at this time  Are transportation arrangements needed? have transportation   If their condition worsens, is the pt aware to call  their PCP or go to the ED? Yes.Made pt aware if condition worsen or start experiencing confusion, sweating, nausea and vomiting, Abdominal pain, uncontrolled BS readings,  chest pain, diff breathing, SOB,  fevers, to refer imediately to ED for further evaluation.  Was the patient provided with contact information for the PCP's office or ED? He has the phone number  Was the pt encouraged to call back with questions or concerns?yes

## 2020-04-05 NOTE — Telephone Encounter (Signed)
Transition Care Management Unsuccessful Follow-up Telephone Call  Date of discharge and from where:  [facility]/04/[facility]  Attempts:  1st Attempt  Reason for unsuccessful TCM follow-up call:  Left voice message

## 2020-04-07 ENCOUNTER — Other Ambulatory Visit: Payer: Self-pay | Admitting: Nurse Practitioner

## 2020-04-07 MED ORDER — VICTOZA 18 MG/3ML ~~LOC~~ SOPN: PEN_INJECTOR | SUBCUTANEOUS | 1 refills | Status: DC

## 2020-04-07 MED ORDER — METFORMIN HCL 500 MG PO TABS: 500.0000 mg | ORAL_TABLET | Freq: Two times a day (BID) | ORAL | 3 refills | Status: DC

## 2020-04-07 NOTE — Telephone Encounter (Signed)
Adding victoza and metformin. He needs to work on weight loss and dietary modifications.

## 2020-04-08 NOTE — Telephone Encounter (Signed)
Called pt made aware of NP message. Stated he will go and pick up his meds. Verbalized understanding

## 2020-04-15 ENCOUNTER — Other Ambulatory Visit: Payer: Self-pay | Admitting: Nurse Practitioner

## 2020-04-15 ENCOUNTER — Other Ambulatory Visit: Payer: Self-pay

## 2020-04-15 ENCOUNTER — Ambulatory Visit: Payer: 59 | Attending: Nurse Practitioner | Admitting: Nurse Practitioner

## 2020-04-15 ENCOUNTER — Encounter: Payer: Self-pay | Admitting: Nurse Practitioner

## 2020-04-15 VITALS — BP 135/79 | HR 73 | Resp 16 | Ht 70.0 in | Wt 290.8 lb

## 2020-04-15 DIAGNOSIS — N529 Male erectile dysfunction, unspecified: Secondary | ICD-10-CM | POA: Diagnosis not present

## 2020-04-15 DIAGNOSIS — E1165 Type 2 diabetes mellitus with hyperglycemia: Secondary | ICD-10-CM

## 2020-04-15 DIAGNOSIS — Z794 Long term (current) use of insulin: Secondary | ICD-10-CM | POA: Diagnosis not present

## 2020-04-15 LAB — GLUCOSE, POCT (MANUAL RESULT ENTRY): POC Glucose: 131 mg/dl — AB (ref 70–99)

## 2020-04-15 MED ORDER — SILDENAFIL CITRATE 100 MG PO TABS: 50.0000 mg | ORAL_TABLET | Freq: Every day | ORAL | 11 refills | Status: DC | PRN

## 2020-04-15 MED FILL — SILDENAFIL CITRATE 100 MG T: 100 | 30 days supply | Qty: 10 | Fill #0

## 2020-04-15 MED FILL — METFORMIN HCL 500 MG TABS: 500 | 30 days supply | Qty: 60 | Fill #0

## 2020-04-15 MED FILL — !VICTOZA 18MG/3ML INJECT: 18 | 27 days supply | Qty: 6 | Fill #0

## 2020-04-15 NOTE — Progress Notes (Signed)
cbg 131 

## 2020-04-15 NOTE — Progress Notes (Addendum)
Assessment & Plan:  [name] was seen today for hospitalization follow-up.  Diagnoses and all orders for this visit:  Type 2 diabetes mellitus with hyperglycemia, with long-term current use of insulin (HCC) -     POCT glucose (manual entry)  Vasculogenic erectile dysfunction, unspecified vasculogenic erectile dysfunction type -     sildenafil (VIAGRA) 100 MG tablet; Take 0.5-1 tablets (50-100 mg total) by mouth daily as needed for erectile dysfunction.    Patient has been counseled on age-appropriate routine health concerns for screening and prevention. These are reviewed and up-to-date. Referrals have been placed accordingly. Immunizations are up-to-date or declined.    Subjective:   Chief Complaint  Patient presents with  . Hospitalization Follow-up   HPI [facility] 35 y.o. male presents to office today for HFU  has a past medical history of Anxiety and Depression.  He was admitted to the hospital on 04-01-2020 with new onset DM. He was treated for nonketotic hyperosmolar hyperglycemia and discharged home with novolin 70/30. After reviewing the ED summary I sent renal dose lisinopril, metformin and victoza to the pharmacy as well. He has not picked these medications up yet. He has a freestyle type sensor and readings were reviewed via the app on his phone today. There are still quite a few readings in 200s during the evening and night. He will pick up the remainder of his medications today. Lab Results  Component Value Date   HGBA1C 11.8 (H) 04/01/2020   Anxiety and Depression Both of these medications (celexa and hydroxyzine) were discontinued in the hospital on 04-01-2020   Erectile Dysfunction Onset about a month ago. He has taken viagra in the past and it worked well. Has difficulty maintaining an erection. Denies premature ejaculation. BP Readings from Last 3 Encounters:  04/15/20 135/79  04/04/20 117/67  06/08/19 (!) 153/95   Review of Systems  Constitutional:  Negative for fever, malaise/fatigue and weight loss.  HENT: Negative.  Negative for nosebleeds.   Eyes: Negative.  Negative for blurred vision, double vision and photophobia.  Respiratory: Negative.  Negative for cough and shortness of breath.   Cardiovascular: Negative.  Negative for chest pain, palpitations and leg swelling.  Gastrointestinal: Negative.  Negative for heartburn, nausea and vomiting.  Genitourinary:       ED  Musculoskeletal: Negative.  Negative for myalgias.  Neurological: Negative.  Negative for dizziness, focal weakness, seizures and headaches.  Psychiatric/Behavioral: Negative.  Negative for suicidal ideas.    Past Medical History:  Diagnosis Date  . Anxiety   . Depression     Past Surgical History:  Procedure Laterality Date  . NO PAST SURGERIES      Family History  Problem Relation Age of Onset  . Anxiety disorder Mother   . Hyperlipidemia Mother   . Diabetes Mother   . Heart disease Father   . Hyperlipidemia Father   . Diabetes Father     Social History Reviewed with no changes to be made today.   Outpatient Medications Prior to Visit  Medication Sig Dispense Refill  . insulin isophane & regular human (NOVOLIN 70/30 FLEXPEN RELION) (70-30) 100 UNIT/ML KwikPen Inject 25 Units into the skin 2 (two) times daily before lunch and supper. 15 mL 1  . Insulin Pen Needle 31G X 5 MM MISC 1 each by Does not apply route daily at 6 (six) AM. 50 each 0  . liraglutide (VICTOZA) 18 MG/3ML SOPN SubQ:  Inject 0.6 mg once daily into the skin. Week 2: increase  to 1.2 mg once daily; week 3: increase to 1.8 mg once daily 9 mL 1  . lisinopril (ZESTRIL) 5 MG tablet Take 1 tablet (5 mg total) by mouth daily. 30 tablet 0  . metFORMIN (GLUCOPHAGE) 500 MG tablet Take 1 tablet (500 mg total) by mouth 2 (two) times daily with a meal. 180 tablet 3   No facility-administered medications prior to visit.    No Known Allergies     Objective:    BP 135/79   Pulse 73   Resp  16   Ht 5\' 10"  (1.778 m)   Wt 290 lb 12.8 oz (131.9 kg)   SpO2 98%   BMI 41.73 kg/m  Wt Readings from Last 3 Encounters:  04/15/20 290 lb 12.8 oz (131.9 kg)  04/02/20 288 lb 9.3 oz (130.9 kg)  12/05/19 300 lb (136.1 kg)    Physical Exam Vitals and nursing note reviewed.  Constitutional:      Appearance: He is well-developed. He is obese.  HENT:     Head: Normocephalic and atraumatic.  Cardiovascular:     Rate and Rhythm: Normal rate and regular rhythm.     Heart sounds: Normal heart sounds. No murmur heard. No friction rub. No gallop.   Pulmonary:     Effort: Pulmonary effort is normal. No tachypnea or respiratory distress.     Breath sounds: Normal breath sounds. No decreased breath sounds, wheezing, rhonchi or rales.  Chest:     Chest wall: No tenderness.  Abdominal:     General: Bowel sounds are normal.     Palpations: Abdomen is soft.  Musculoskeletal:        General: Normal range of motion.     Cervical back: Normal range of motion.  Skin:    General: Skin is warm and dry.  Neurological:     Mental Status: He is alert and oriented to person, place, and time.     Coordination: Coordination normal.  Psychiatric:        Behavior: Behavior normal. Behavior is cooperative.        Thought Content: Thought content normal.        Judgment: Judgment normal.          Patient has been counseled extensively about nutrition and exercise as well as the importance of adherence with medications and regular follow-up. The patient was given clear instructions to go to ER or return to medical center if symptoms don't improve, worsen or new problems develop. The patient verbalized understanding.   Follow-up: Return for cancel appt with luke. Will see me on april 20th. April 22, FNP-BC [facility] [place], [place]   04/15/2020, 12:32 PM

## 2020-04-17 ENCOUNTER — Ambulatory Visit: Payer: Medicaid Other | Admitting: Pharmacist

## 2020-04-25 ENCOUNTER — Other Ambulatory Visit: Payer: Self-pay | Admitting: Nurse Practitioner

## 2020-04-25 ENCOUNTER — Encounter: Payer: Self-pay | Admitting: Nurse Practitioner

## 2020-04-25 MED ORDER — INSULIN PEN NEEDLE 31G X 5 MM MISC: 6 refills | Status: DC

## 2020-05-15 ENCOUNTER — Other Ambulatory Visit: Payer: Self-pay | Admitting: Nurse Practitioner

## 2020-05-15 ENCOUNTER — Ambulatory Visit: Payer: Medicaid Other | Admitting: Nurse Practitioner

## 2020-05-15 MED ORDER — ESCITALOPRAM OXALATE 10 MG PO TABS
ORAL_TABLET | Freq: Every day | ORAL | 2 refills | Status: DC
Start: 2020-05-15 — End: 2021-06-20

## 2020-05-15 MED ORDER — INSULIN ISOPHANE & REGULAR (HUMAN 70-30)100 UNIT/ML KWIKPEN: PEN_INJECTOR | SUBCUTANEOUS | 1 refills | Status: DC

## 2020-05-15 MED ORDER — VICTOZA 18 MG/3ML ~~LOC~~ SOPN: 1.8000 mg | PEN_INJECTOR | Freq: Every day | SUBCUTANEOUS | 1 refills | Status: DC

## 2020-05-15 MED ORDER — LISINOPRIL 5 MG PO TABS: ORAL_TABLET | Freq: Every day | ORAL | 1 refills | Status: DC

## 2020-05-15 MED ORDER — INSULIN PEN NEEDLE 31G X 5 MM MISC: 6 refills | Status: DC

## 2020-05-15 MED ORDER — METFORMIN HCL 500 MG PO TABS: 500.0000 mg | ORAL_TABLET | Freq: Two times a day (BID) | ORAL | 3 refills | Status: DC

## 2020-06-08 LAB — HM DIABETES EYE EXAM

## 2020-07-11 ENCOUNTER — Other Ambulatory Visit: Payer: Self-pay

## 2020-07-17 ENCOUNTER — Encounter: Payer: Self-pay | Admitting: Nurse Practitioner

## 2020-07-17 ENCOUNTER — Ambulatory Visit: Payer: 59 | Attending: Nurse Practitioner | Admitting: Nurse Practitioner

## 2020-07-17 ENCOUNTER — Other Ambulatory Visit: Payer: Self-pay

## 2020-07-17 DIAGNOSIS — Z794 Long term (current) use of insulin: Secondary | ICD-10-CM

## 2020-07-17 DIAGNOSIS — E119 Type 2 diabetes mellitus without complications: Secondary | ICD-10-CM

## 2020-07-17 DIAGNOSIS — Z1159 Encounter for screening for other viral diseases: Secondary | ICD-10-CM

## 2020-07-17 NOTE — Progress Notes (Signed)
Virtual Visit via Telephone Note Due to national recommendations of social distancing due to [place] 19, telehealth visit is felt to be most appropriate for this patient at this time.  I discussed the limitations, risks, security and privacy concerns of performing an evaluation and management service by telephone and the availability of in person appointments. I also discussed with the patient that there may be a patient responsible charge related to this service. The patient expressed understanding and agreed to proceed.    I connected with Desiree Lucy Counts on 07/17/20  at   2:10 PM EDT  EDT by telephone and verified that I am speaking with the correct person using two identifiers.  Location of Patient: Private Residence   Location of Provider: [place] [facility] [employer] Office    Persons participating in Telemedicine visit: Geryl Rankins FNP-BC [place]    History of Present Illness: Telemedicine visit for: follow up DM   DM2 States numbers are well controlled. Average Morning readings fasting: 110s. Post prandial readings 120-130s.  Currently administering Humulin 70/30 mix 25 units twice daily, Victoza 1.8 mg daily and metformin 500 mg twice daily.  He denies any symptoms of hypo or hyperglycemia or any blood glucose readings less than 90. He is on renal dose ACE. Denies cough.  Lab Results  Component Value Date   HGBA1C 11.8 (H) 04/01/2020     Depression and Anxiety Taking lexapro 10 mg daily as prescribed.  Depression screen [facility] 2/9 07/17/2020 12/05/2019  Decreased Interest 2 0  Down, Depressed, Hopeless 0 0  PHQ - 2 Score 2 0  Altered sleeping 0 0  Tired, decreased energy 2 0  Change in appetite 0 0  Feeling bad or failure about yourself  0 0  Trouble concentrating 3 0  Moving slowly or fidgety/restless 0 0  Suicidal thoughts 0 0  PHQ-9 Score 7 0  Difficult doing work/chores Not difficult at all -  Some encounter information is confidential and  restricted. Go to Review Flowsheets activity to see all data.    Past Medical History:  Diagnosis Date   Anxiety    Depression     Past Surgical History:  Procedure Laterality Date   NO PAST SURGERIES      Family History  Problem Relation Age of Onset   Anxiety disorder Mother    Hyperlipidemia Mother    Diabetes Mother    Heart disease Father    Hyperlipidemia Father    Diabetes Father     Social History   Socioeconomic History   Marital status: Single    Spouse name: Not on file   Number of children: Not on file   Years of education: Not on file   Highest education level: Not on file  Occupational History   Not on file  Tobacco Use   Smoking status: Never   Smokeless tobacco: Never  Substance and Sexual Activity   Alcohol use: Yes    Comment: Occasionally    Drug use: Not Currently   Sexual activity: Yes  Other Topics Concern   Not on file  Social History Narrative   Not on file   Social Determinants of Health   Financial Resource Strain: Not on file  Food Insecurity: Not on file  Transportation Needs: Not on file  Physical Activity: Not on file  Stress: Not on file  Social Connections: Not on file     Observations/Objective: Awake, alert and oriented x 3   Review of Systems  Constitutional:  Negative for fever, malaise/fatigue and weight loss.  HENT: Negative.  Negative for nosebleeds.   Eyes: Negative.  Negative for blurred vision, double vision and photophobia.  Respiratory: Negative.  Negative for cough and shortness of breath.   Cardiovascular: Negative.  Negative for chest pain, palpitations and leg swelling.  Gastrointestinal: Negative.  Negative for heartburn, nausea and vomiting.  Musculoskeletal: Negative.  Negative for myalgias.  Neurological: Negative.  Negative for dizziness, focal weakness, seizures and headaches.  Psychiatric/Behavioral:  Positive for depression. Negative for suicidal ideas. The patient is nervous/anxious.     Assessment and Plan: Diagnoses and all orders for this visit:  Type 2 diabetes mellitus without complication, with long-term current use of insulin ([place]) -     Lipid panel; Future -     Hemoglobin A1c; Future -     CMP14+EGFR; Future Continue blood sugar control as discussed in office today, low carbohydrate diet, and regular physical exercise as tolerated, 150 minutes per week (30 min each day, 5 days per week, or 50 min 3 days per week). Keep blood sugar logs with fasting goal of 90-130 mg/dl, post prandial (after you eat) less than 180.  For Hypoglycemia: BS <60 and Hyperglycemia BS >400; contact the clinic ASAP. Annual eye exams and foot exams are recommended.   Need for hepatitis C screening test -     HCV Ab w Reflex to Quant PCR; Future    Follow Up Instructions Return in about 3 months (around 10/17/2020).     I discussed the assessment and treatment plan with the patient. The patient was provided an opportunity to ask questions and all were answered. The patient agreed with the plan and demonstrated an understanding of the instructions.   The patient was advised to call back or seek an in-person evaluation if the symptoms worsen or if the condition fails to improve as anticipated.  I provided 12 minutes of non-face-to-face time during this encounter including median intraservice time, reviewing previous notes, labs, imaging, medications and explaining diagnosis and management.  Gildardo Pounds, FNP-BC

## 2020-07-31 ENCOUNTER — Other Ambulatory Visit: Payer: Self-pay

## 2020-07-31 ENCOUNTER — Ambulatory Visit: Payer: 59 | Attending: Nurse Practitioner

## 2020-07-31 DIAGNOSIS — Z1159 Encounter for screening for other viral diseases: Secondary | ICD-10-CM

## 2020-07-31 DIAGNOSIS — E119 Type 2 diabetes mellitus without complications: Secondary | ICD-10-CM

## 2020-08-01 LAB — CMP14+EGFR
ALT: 15 IU/L (ref 0–44)
AST: 14 IU/L (ref 0–40)
Albumin/Globulin Ratio: 1.8 (ref 1.2–2.2)
Albumin: 4.5 g/dL (ref 4.0–5.0)
Alkaline Phosphatase: 61 IU/L (ref 44–121)
BUN/Creatinine Ratio: 15 (ref 9–20)
BUN: 13 mg/dL (ref 6–20)
Bilirubin Total: 0.4 mg/dL (ref 0.0–1.2)
CO2: 24 mmol/L (ref 20–29)
Calcium: 9.5 mg/dL (ref 8.7–10.2)
Chloride: 102 mmol/L (ref 96–106)
Creatinine, Ser: 0.87 mg/dL (ref 0.76–1.27)
Globulin, Total: 2.5 g/dL (ref 1.5–4.5)
Glucose: 107 mg/dL — ABNORMAL HIGH (ref 65–99)
Potassium: 4 mmol/L (ref 3.5–5.2)
Sodium: 141 mmol/L (ref 134–144)
Total Protein: 7 g/dL (ref 6.0–8.5)
eGFR: 117 mL/min/{1.73_m2} (ref >59)

## 2020-08-01 LAB — LIPID PANEL
Chol/HDL Ratio: 3.8 ratio (ref 0.0–5.0)
Cholesterol, Total: 142 mg/dL (ref 100–199)
HDL: 37 mg/dL — ABNORMAL LOW (ref >39)
LDL Chol Calc (NIH): 96 mg/dL (ref 0–99)
Triglycerides: 38 mg/dL (ref 0–149)
VLDL Cholesterol Cal: 9 mg/dL (ref 5–40)

## 2020-08-01 LAB — HEMOGLOBIN A1C
Est. average glucose Bld gHb Est-mCnc: 126 mg/dL
Hgb A1c MFr Bld: 6 % — ABNORMAL HIGH (ref 4.8–5.6)

## 2020-08-01 LAB — HCV AB W REFLEX TO QUANT PCR: HCV Ab: 0.1 s/co ratio (ref 0.0–0.9)

## 2020-08-01 LAB — HCV INTERPRETATION

## 2020-08-03 ENCOUNTER — Encounter: Payer: Self-pay | Admitting: Nurse Practitioner

## 2020-08-03 ENCOUNTER — Other Ambulatory Visit: Payer: Self-pay | Admitting: Nurse Practitioner

## 2020-08-03 MED ORDER — INSULIN ISOPHANE & REGULAR (HUMAN 70-30)100 UNIT/ML KWIKPEN: 15.0000 [IU] | PEN_INJECTOR | Freq: Two times a day (BID) | SUBCUTANEOUS | 1 refills | Status: DC

## 2020-08-05 ENCOUNTER — Other Ambulatory Visit: Payer: Self-pay

## 2020-10-15 ENCOUNTER — Ambulatory Visit (HOSPITAL_COMMUNITY)
Admission: EM | Admit: 2020-10-15 | Discharge: 2020-10-15 | Disposition: A | Discharge status: Home / Self Care | Payer: 59 | Attending: Student | Admitting: Student

## 2020-10-15 ENCOUNTER — Emergency Department (HOSPITAL_COMMUNITY)
Admission: EM | Admit: 2020-10-15 | Discharge: 2020-10-16 | Disposition: A | Discharge status: Home / Self Care | Payer: 59 | Attending: Emergency Medicine | Admitting: Emergency Medicine

## 2020-10-15 ENCOUNTER — Encounter (HOSPITAL_COMMUNITY): Payer: Self-pay

## 2020-10-15 ENCOUNTER — Other Ambulatory Visit: Payer: Self-pay

## 2020-10-15 ENCOUNTER — Emergency Department (HOSPITAL_COMMUNITY): Payer: 59

## 2020-10-15 DIAGNOSIS — Z7984 Long term (current) use of oral hypoglycemic drugs: Secondary | ICD-10-CM | POA: Insufficient documentation

## 2020-10-15 DIAGNOSIS — Z794 Long term (current) use of insulin: Secondary | ICD-10-CM | POA: Diagnosis not present

## 2020-10-15 DIAGNOSIS — Z79899 Other long term (current) drug therapy: Secondary | ICD-10-CM | POA: Diagnosis not present

## 2020-10-15 DIAGNOSIS — J069 Acute upper respiratory infection, unspecified: Secondary | ICD-10-CM

## 2020-10-15 DIAGNOSIS — R0600 Dyspnea, unspecified: Secondary | ICD-10-CM | POA: Diagnosis present

## 2020-10-15 DIAGNOSIS — J40 Bronchitis, not specified as acute or chronic: Secondary | ICD-10-CM | POA: Insufficient documentation

## 2020-10-15 DIAGNOSIS — I10 Essential (primary) hypertension: Secondary | ICD-10-CM | POA: Insufficient documentation

## 2020-10-15 DIAGNOSIS — Z20822 Contact with and (suspected) exposure to covid-19: Secondary | ICD-10-CM | POA: Diagnosis not present

## 2020-10-15 DIAGNOSIS — E119 Type 2 diabetes mellitus without complications: Secondary | ICD-10-CM | POA: Diagnosis not present

## 2020-10-15 HISTORY — DX: Essential (primary) hypertension: I10

## 2020-10-15 HISTORY — DX: Other specified diabetes mellitus without complications: E13.9

## 2020-10-15 HISTORY — DX: Obesity, unspecified: E66.9

## 2020-10-15 MED ORDER — ALBUTEROL SULFATE HFA 108 (90 BASE) MCG/ACT IN AERS
2.0000 | INHALATION_SPRAY | Freq: Once | RESPIRATORY_TRACT | Status: AC
  Administered 2020-10-15: 2 via RESPIRATORY_TRACT

## 2020-10-15 MED ORDER — ALBUTEROL SULFATE HFA 108 (90 BASE) MCG/ACT IN AERS
INHALATION_SPRAY | RESPIRATORY_TRACT | Status: AC
  Filled 2020-10-15: qty 6.7

## 2020-10-15 NOTE — Discharge Instructions (Addendum)
-  With your vital signs and lung sounds, I am concerned that you have an infection or other serious issue with the lungs.  I am recommending that you head straight to the emergency department for this evaluation, your situation could become even worse or even life-threatening without this evaluation.  Please have straight there, if symptoms get worse on the way to stop and call 911.

## 2020-10-15 NOTE — ED Notes (Signed)
Patient is being discharged from the Urgent Care and sent to the Emergency Department via pov . Per Ignacia Bayley, PA, patient is in need of higher level of care due to tachycardia, sob. Patient is aware and verbalizes understanding of plan of care.  Vitals:   10/15/20 1941 10/15/20 1949  BP:    Pulse:  (!) 130  Resp:  (!) 36  Temp: 99.6 F (37.6 C)   SpO2:  97%

## 2020-10-15 NOTE — ED Triage Notes (Signed)
Pt complains of SHOB, cough, and congestion x 3 days.

## 2020-10-15 NOTE — ED Provider Notes (Signed)
[facility]    CSN: [id number] Arrival date & time: 10/15/20  1911      History   Chief Complaint Chief Complaint  Patient presents with   Shortness of Breath   Cough   Nasal Congestion    HPI JAMORIAN DIMARIA is a 35 y.o. male presenting with shortness of breath, congestion, cough for about 3 days, following exposure to daughter who is sick.  States that they have both tested negative at home for COVID, test taken on day 1 or 2 of symptoms.  Medical history of diabetes, obesity, hypertension.  Denies history of cardiopulmonary disease.  States shortness of breath at rest, worse with ambulation.  Nonproductive cough.  Fevers and chills.  Denies dizziness, chest pain, weakness.  HPI  Past Medical History:  Diagnosis Date   Anxiety    Depression    Diabetes 1.5, managed as type 2 (HCC)    Hypertension    Obese     Patient Active Problem List   Diagnosis Date Noted   Hypokalemia    Diabetes mellitus with nonketotic hyperosmolarity (HCC) 04/01/2020   Anxiety with depression 04/01/2020   Hypertension 04/01/2020   Class 3 severe obesity due to excess calories with body mass index (BMI) of 40.0 to 44.9 in adult [facility]) 11/25/2017   Vitiligo 11/25/2017    Past Surgical History:  Procedure Laterality Date   NO PAST SURGERIES         Home Medications    Prior to Admission medications   Medication Sig Start Date End Date Taking? Authorizing Provider  escitalopram (LEXAPRO) 10 MG tablet TAKE 1 TABLET BY MOUTH ONCE A DAY 05/15/20 05/15/21  Claiborne Rigg, NP  insulin isophane & regular human (HUMULIN 70/30 MIX) (70-30) 100 UNIT/ML KwikPen Inject 15 Units into the skin 2 (two) times daily. 08/03/20 11/01/20  Claiborne Rigg, NP  Insulin Pen Needle 31G X 5 MM MISC Use as instructed. Inject into the skin once daily 05/15/20   Claiborne Rigg, NP  liraglutide (VICTOZA) 18 MG/3ML SOPN Inject 1.8 mg into the skin daily. 05/15/20 08/13/20  Claiborne Rigg, NP  lisinopril  (ZESTRIL) 5 MG tablet TAKE 1 TABLET (5 MG TOTAL) BY MOUTH DAILY. 05/15/20 08/13/20  Claiborne Rigg, NP  metFORMIN (GLUCOPHAGE) 500 MG tablet Take 1 tablet (500 mg total) by mouth 2 (two) times daily with a meal. 05/15/20   Claiborne Rigg, NP  sildenafil (VIAGRA) 100 MG tablet TAKE 0.5-1 TABLETS (50-100 MG TOTAL) BY MOUTH DAILY AS NEEDED FOR ERECTILE DYSFUNCTION. 04/15/20 04/15/21  Claiborne Rigg, NP    Family History Family History  Problem Relation Age of Onset   Anxiety disorder Mother    Hyperlipidemia Mother    Diabetes Mother    Heart disease Father    Hyperlipidemia Father    Diabetes Father     Social History Social History   Tobacco Use   Smoking status: Never   Smokeless tobacco: Never  Substance Use Topics   Alcohol use: Yes    Comment: Occasionally    Drug use: Not Currently     Allergies   Patient has no known allergies.   Review of Systems Review of Systems  Constitutional:  Negative for appetite change, chills and fever.  HENT:  Positive for congestion. Negative for ear pain, rhinorrhea, sinus pressure, sinus pain and sore throat.   Eyes:  Negative for redness and visual disturbance.  Respiratory:  Positive for cough and shortness of breath. Negative  for chest tightness and wheezing.   Cardiovascular:  Negative for chest pain and palpitations.  Gastrointestinal:  Negative for abdominal pain, constipation, diarrhea, nausea and vomiting.  Genitourinary:  Negative for dysuria, frequency and urgency.  Musculoskeletal:  Negative for myalgias.  Neurological:  Negative for dizziness, weakness and headaches.  Psychiatric/Behavioral:  Negative for confusion.   All other systems reviewed and are negative.   Physical Exam Triage Vital Signs ED Triage Vitals  Enc Vitals Group     BP 10/15/20 1933 (!) 154/88     Pulse Rate 10/15/20 1933 (!) 130     Resp 10/15/20 1933 19     Temp 10/15/20 1941 99.6 F (37.6 C)     Temp Source 10/15/20 1933 Oral     SpO2  10/15/20 1933 95 %     Weight --      Height --      Head Circumference --      Peak Flow --      Pain Score 10/15/20 1932 0     Pain Loc --      Pain Edu? --      Excl. in GC? --    No data found.  Updated Vital Signs BP (!) 154/88 (BP Location: Right Arm)   Pulse (!) 130   Temp 99.6 F (37.6 C) (Oral)   Resp (!) 36   SpO2 97%   Visual Acuity Right Eye Distance:   Left Eye Distance:   Bilateral Distance:    Right Eye Near:   Left Eye Near:    Bilateral Near:     Physical Exam Vitals reviewed.  Constitutional:      General: He is not in acute distress.    Appearance: Normal appearance. He is ill-appearing.  HENT:     Head: Normocephalic and atraumatic.     Right Ear: Tympanic membrane, ear canal and external ear normal. No tenderness. No middle ear effusion. There is no impacted cerumen. Tympanic membrane is not perforated, erythematous, retracted or bulging.     Left Ear: Tympanic membrane, ear canal and external ear normal. No tenderness.  No middle ear effusion. There is no impacted cerumen. Tympanic membrane is not perforated, erythematous, retracted or bulging.     Nose: Nose normal. No congestion.     Mouth/Throat:     Mouth: Mucous membranes are moist.     Pharynx: Uvula midline. No oropharyngeal exudate or posterior oropharyngeal erythema.  Eyes:     Extraocular Movements: Extraocular movements intact.     Pupils: Pupils are equal, round, and reactive to light.  Cardiovascular:     Rate and Rhythm: Normal rate and regular rhythm.     Heart sounds: Normal heart sounds.  Pulmonary:     Effort: Pulmonary effort is normal.     Breath sounds: Wheezing and rhonchi present. No decreased breath sounds or rales.     Comments: Wheezes and rhonchi throughout Abdominal:     Palpations: Abdomen is soft.     Tenderness: There is no abdominal tenderness. There is no guarding or rebound.  Neurological:     General: No focal deficit present.     Mental Status: He is  alert and oriented to person, place, and time.  Psychiatric:        Mood and Affect: Mood normal.        Behavior: Behavior normal.        Thought Content: Thought content normal.        Judgment: Judgment normal.  UC Treatments / Results  Labs (all labs ordered are listed, but only abnormal results are displayed) Labs Reviewed - No data to display  EKG   Radiology No results found.  Procedures Procedures (including critical care time)  Medications Ordered in UC Medications  albuterol (VENTOLIN HFA) 108 (90 Base) MCG/ACT inhaler 2 puff (has no administration in time range)    Initial Impression / Assessment and Plan / UC Course  I have reviewed the triage vital signs and the nursing notes.  Pertinent labs & imaging results that were available during my care of the patient were reviewed by me and considered in my medical decision making (see chart for details).     This patient is a very pleasant 35 y.o. year old male presenting with shortness of breath. Rhonchi throughout.   Tachycardic at 130 and tachypneic at 36. Albuterol administered EKG with sinus tachycardia, changed from 03/2020 EKG.  I do have concern for early respiratory failure or pneumonia given symptoms, patient has increased work of breathing.  He denies history of cardiopulmonary disease, but there are rhonchi throughout.  Discussed my concerns and patient is amenable to heading to the emergency department for this evaluation.  Hemodynamically stable for transport in personal vehicle at this time.  Final Clinical Impressions(s) / UC Diagnoses   Final diagnoses:  Viral upper respiratory tract infection     Discharge Instructions      -With your vital signs and lung sounds, I am concerned that you have an infection or other serious issue with the lungs.  I am recommending that you head straight to the emergency department for this evaluation, your situation could become even worse or even  life-threatening without this evaluation.  Please have straight there, if symptoms get worse on the way to stop and call 911.      ED Prescriptions   None    PDMP not reviewed this encounter.   Rhys Martini, PA-C 10/15/20 2019

## 2020-10-15 NOTE — ED Triage Notes (Signed)
Pt is here with a cough & chest congestion that started 3 days ago with mild SOB. Pt has taken OTC meds to relieve discomfort.

## 2020-10-16 ENCOUNTER — Encounter (HOSPITAL_COMMUNITY): Payer: Self-pay | Admitting: Emergency Medicine

## 2020-10-16 LAB — CBC WITH DIFFERENTIAL/PLATELET
Abs Immature Granulocytes: 0.03 10*3/uL (ref 0.00–0.07)
Basophils Absolute: 0.1 10*3/uL (ref 0.0–0.1)
Basophils Relative: 1 %
Eosinophils Absolute: 0.8 10*3/uL — ABNORMAL HIGH (ref 0.0–0.5)
Eosinophils Relative: 6 %
HCT: 42.6 % (ref 39.0–52.0)
Hemoglobin: 13.8 g/dL (ref 13.0–17.0)
Immature Granulocytes: 0 %
Lymphocytes Relative: 22 %
Lymphs Abs: 2.7 10*3/uL (ref 0.7–4.0)
MCH: 30.7 pg (ref 26.0–34.0)
MCHC: 32.4 g/dL (ref 30.0–36.0)
MCV: 94.9 fL (ref 80.0–100.0)
Monocytes Absolute: 1.6 10*3/uL — ABNORMAL HIGH (ref 0.1–1.0)
Monocytes Relative: 13 %
Neutro Abs: 7.1 10*3/uL (ref 1.7–7.7)
Neutrophils Relative %: 58 %
Platelets: 303 10*3/uL (ref 150–400)
RBC: 4.49 MIL/uL (ref 4.22–5.81)
RDW: 12.1 % (ref 11.5–15.5)
WBC: 12.2 10*3/uL — ABNORMAL HIGH (ref 4.0–10.5)
nRBC: 0 % (ref 0.0–0.2)

## 2020-10-16 LAB — COMPREHENSIVE METABOLIC PANEL
ALT: 21 U/L (ref 0–44)
AST: 18 U/L (ref 15–41)
Albumin: 4.3 g/dL (ref 3.5–5.0)
Alkaline Phosphatase: 63 U/L (ref 38–126)
Anion gap: 10 (ref 5–15)
BUN: 14 mg/dL (ref 6–20)
CO2: 22 mmol/L (ref 22–32)
Calcium: 9.8 mg/dL (ref 8.9–10.3)
Chloride: 104 mmol/L (ref 98–111)
Creatinine, Ser: 0.86 mg/dL (ref 0.61–1.24)
GFR, Estimated: >60 mL/min (ref >60)
Glucose, Bld: 156 mg/dL — ABNORMAL HIGH (ref 70–99)
Potassium: 3.5 mmol/L (ref 3.5–5.1)
Sodium: 136 mmol/L (ref 135–145)
Total Bilirubin: 1.2 mg/dL (ref 0.3–1.2)
Total Protein: 8.1 g/dL (ref 6.5–8.1)

## 2020-10-16 LAB — RESP PANEL BY RT-PCR (FLU A&B, COVID) ARPGX2
Influenza A by PCR: NEGATIVE
Influenza B by PCR: NEGATIVE
SARS Coronavirus 2 by RT PCR: NEGATIVE

## 2020-10-16 LAB — D-DIMER, QUANTITATIVE: D-Dimer, Quant: 0.27 ug/mL-FEU (ref 0.00–0.50)

## 2020-10-16 MED ORDER — CETIRIZINE HCL 10 MG PO TABS: 10.0000 mg | ORAL_TABLET | Freq: Every day | ORAL | 0 refills | Status: DC | PRN

## 2020-10-16 MED ORDER — FLUTICASONE PROPIONATE HFA 44 MCG/ACT IN AERO: 2.0000 | INHALATION_SPRAY | Freq: Every day | RESPIRATORY_TRACT | 0 refills | Status: DC | PRN

## 2020-10-16 MED ORDER — IPRATROPIUM-ALBUTEROL 0.5-2.5 (3) MG/3ML IN SOLN
3.0000 mL | Freq: Once | RESPIRATORY_TRACT | Status: AC
  Administered 2020-10-16: 3 mL via RESPIRATORY_TRACT
  Filled 2020-10-16: qty 3

## 2020-10-16 MED ORDER — SODIUM CHLORIDE 0.9 % IV BOLUS
1000.0000 mL | Freq: Once | INTRAVENOUS | Status: AC
  Administered 2020-10-16: 1000 mL via INTRAVENOUS

## 2020-10-16 NOTE — ED Provider Notes (Signed)
[facility]-EMERGENCY DEPT Provider Note   CSN: [id number] Arrival date & time: 10/15/20  2100     History Chief Complaint  Patient presents with   Shortness of Breath   Cough   Nasal Congestion    Jose Burgess is a 35 y.o. male with a hx of anxiety, depression, hypertension, and DM who presents to the ED with complaints of dyspnea x 2-3 days. Patient reports nasal congestion, sore throat, cough, post tussive emesis, diarrhea, & shortness of breath. No alleviating/aggravating factors. No intervention PTA. Denies fever, abdominal pain, chest pain, syncope, leg pain/swelling, hemoptysis, recent surgery/trauma, recent long travel, hormone use, personal hx of cancer, or hx of DVT/PE.     HPI     Past Medical History:  Diagnosis Date   Anxiety    Depression    Diabetes 1.5, managed as type 2 (HCC)    Hypertension    Obese     Patient Active Problem List   Diagnosis Date Noted   Hypokalemia    Diabetes mellitus with nonketotic hyperosmolarity (HCC) 04/01/2020   Anxiety with depression 04/01/2020   Hypertension 04/01/2020   Class 3 severe obesity due to excess calories with body mass index (BMI) of 40.0 to 44.9 in adult [facility]) 11/25/2017   Vitiligo 11/25/2017    Past Surgical History:  Procedure Laterality Date   NO PAST SURGERIES         Family History  Problem Relation Age of Onset   Anxiety disorder Mother    Hyperlipidemia Mother    Diabetes Mother    Heart disease Father    Hyperlipidemia Father    Diabetes Father     Social History   Tobacco Use   Smoking status: Never   Smokeless tobacco: Never  Substance Use Topics   Alcohol use: Yes    Comment: Occasionally    Drug use: Not Currently    Home Medications Prior to Admission medications   Medication Sig Start Date End Date Taking? Authorizing Provider  escitalopram (LEXAPRO) 10 MG tablet TAKE 1 TABLET BY MOUTH ONCE A DAY 05/15/20 05/15/21  Claiborne Rigg, NP  insulin  isophane & regular human (HUMULIN 70/30 MIX) (70-30) 100 UNIT/ML KwikPen Inject 15 Units into the skin 2 (two) times daily. 08/03/20 11/01/20  Claiborne Rigg, NP  Insulin Pen Needle 31G X 5 MM MISC Use as instructed. Inject into the skin once daily 05/15/20   Claiborne Rigg, NP  liraglutide (VICTOZA) 18 MG/3ML SOPN Inject 1.8 mg into the skin daily. 05/15/20 08/13/20  Claiborne Rigg, NP  lisinopril (ZESTRIL) 5 MG tablet TAKE 1 TABLET (5 MG TOTAL) BY MOUTH DAILY. 05/15/20 08/13/20  Claiborne Rigg, NP  metFORMIN (GLUCOPHAGE) 500 MG tablet Take 1 tablet (500 mg total) by mouth 2 (two) times daily with a meal. 05/15/20   Claiborne Rigg, NP  sildenafil (VIAGRA) 100 MG tablet TAKE 0.5-1 TABLETS (50-100 MG TOTAL) BY MOUTH DAILY AS NEEDED FOR ERECTILE DYSFUNCTION. 04/15/20 04/15/21  Claiborne Rigg, NP    Allergies    Patient has no known allergies.  Review of Systems   Review of Systems  Constitutional:  Negative for fever.  HENT:  Positive for congestion and sore throat.   Respiratory:  Positive for cough and shortness of breath.   Cardiovascular:  Negative for chest pain and leg swelling.  Gastrointestinal:  Positive for diarrhea and vomiting. Negative for abdominal pain.  Genitourinary:  Negative for dysuria.  Neurological:  Negative for  syncope.  All other systems reviewed and are negative.  Physical Exam Updated Vital Signs BP (!) 153/93 (BP Location: Left Arm)   Pulse (!) 126   Temp 98.9 F (37.2 C) (Oral)   Resp (!) 24   Ht 5\' 10"  (1.778 m)   Wt 136.1 kg   SpO2 95%   BMI 43.05 kg/m   Physical Exam Vitals and nursing note reviewed.  Constitutional:      General: Jose Burgess is not in acute distress.    Appearance: Jose Burgess is well-developed. Jose Burgess is not toxic-appearing.  HENT:     Head: Normocephalic and atraumatic.     Right Ear: Ear canal normal. Tympanic membrane is not perforated, erythematous, retracted or bulging.     Left Ear: Ear canal normal. Tympanic membrane is not perforated,  erythematous, retracted or bulging.     Ears:     Comments: No mastoid erythema/swellng/tenderness.     Nose:     Right Sinus: No maxillary sinus tenderness or frontal sinus tenderness.     Left Sinus: No maxillary sinus tenderness or frontal sinus tenderness.     Mouth/Throat:     Pharynx: Oropharynx is clear. Uvula midline. No oropharyngeal exudate or posterior oropharyngeal erythema.     Comments: Posterior oropharynx is symmetric appearing. Patient tolerating own secretions without difficulty. No trismus. No drooling. No hot potato voice. No swelling beneath the tongue, submandibular compartment is soft.  Eyes:     General:        Right eye: No discharge.        Left eye: No discharge.     Conjunctiva/sclera: Conjunctivae normal.  Cardiovascular:     Rate and Rhythm: Regular rhythm. Tachycardia present.  Pulmonary:     Effort: Pulmonary effort is normal. No respiratory distress.     Breath sounds: Wheezing (expiratory throughout) present. No rhonchi or rales.  Abdominal:     General: There is no distension.     Palpations: Abdomen is soft.     Tenderness: There is no abdominal tenderness.  Musculoskeletal:     Cervical back: Neck supple. No rigidity.     Right lower leg: No edema.     Left lower leg: No edema.  Lymphadenopathy:     Cervical: No cervical adenopathy.  Skin:    General: Skin is warm and dry.     Findings: No rash.  Neurological:     Mental Status: Jose Burgess is alert.  Psychiatric:        Behavior: Behavior normal.    ED Results / Procedures / Treatments   Labs (all labs ordered are listed, but only abnormal results are displayed) Labs Reviewed  CBC WITH DIFFERENTIAL/PLATELET - Abnormal; Notable for the following components:      Result Value   WBC 12.2 (*)    Monocytes Absolute 1.6 (*)    Eosinophils Absolute 0.8 (*)    All other components within normal limits  COMPREHENSIVE METABOLIC PANEL - Abnormal; Notable for the following components:   Glucose, Bld  156 (*)    All other components within normal limits  RESP PANEL BY RT-PCR (FLU A&B, COVID) ARPGX2  D-DIMER, QUANTITATIVE    EKG EKG Interpretation  Date/Time:  Tuesday October 15 2020 22:18:29 EDT Ventricular Rate:  123 PR Interval:  133 QRS Duration: 101 QT Interval:  304 QTC Calculation: 435 R Axis:   63 Text Interpretation: Sinus tachycardia RSR' in V1 or V2, probably normal variant Confirmed by [id number] ([id number]) on 10/15/2020 10:24:03 PM  Radiology DG Chest 2 View  Result Date: 10/15/2020 CLINICAL DATA:  Shortness of breath. EXAM: CHEST - 2 VIEW COMPARISON:  Chest x-ray 06/08/2019. FINDINGS: The heart size and mediastinal contours are within normal limits. Both lungs are clear. The visualized skeletal structures are unremarkable. IMPRESSION: No active cardiopulmonary disease. Electronically Signed   By: Darliss Cheney M.D.   On: 10/15/2020 21:44    Procedures Procedures   Medications Ordered in ED Medications - No data to display  ED Course  I have reviewed the triage vital signs and the nursing notes.  Pertinent labs & imaging results that were available during my care of the patient were reviewed by me and considered in my medical decision making (see chart for details).    MDM Rules/Calculators/A&P                          Patient presents to the ED with  dyspnea.  Patient is nontoxic, in no acute distress, vitals notable for tachycardia.   Additional history obtained:  Additional history obtained from chart review & nursing note review.   Lab Tests:  I Ordered, reviewed, and interpreted labs, which included:  CBC: Mildly cytosis CMP: Mild hyperglycemia without acidosis or anion gap elevation D-dimer: Within normal limits COVID/flu testing: Negative  Imaging Studies ordered:  CXR ordered by triage, I independently visualized and interpreted imaging which showed No active cardiopulmonary disease  ED Course:  Exam is without signs of AOM, AOE, or  mastoiditis. Oropharyngeal exam is benign. No sinus tenderness. No meningeal signs. Lungs are CTA without focal adventitious sounds, no signs of increased work of breathing, CXR without infiltrate, doubt CAP. Abdomen nontender w/o peritoneal signs. Covid/flu negative.  Labs overall reassuring.  D-dimer negative, low risk [place], doubt PE.  Overall suspect viral. Patient with wheezing on initial exam, cleared following duoneb. Likely bronchitis, will discharge with albuterol inhaler, zyrtec, and flovent, given Jose Burgess has diabetes and no hx of COPD/asthma and wheezing cleared after 1 duoneb do not feel that oral steroids are necessary at this time, discussed with attending in agreement. I discussed results, treatment plan, need for follow-up, and return precautions with the patient. Provided opportunity for questions, patient confirmed understanding and is in agreement with plan.   Blood pressure 126/77, pulse (!) 104, temperature 98.9 F (37.2 C), temperature source Oral, resp. rate 16, height 5\' 10"  (1.778 m), weight 136.1 kg, SpO2 94 %.   Ambulatory SPO2 >95% on RA without findings of respiratory distress.   Portions of this note were generated with . Dictation errors may occur despite best attempts at proofreading.  Final Clinical Impression(s) / ED Diagnoses Final diagnoses:  Bronchitis    Rx / DC Orders ED Discharge Orders     None      Rudolfo Brandow Longsworth was evaluated in Emergency Department on 10/16/2020 for the symptoms described in the history of present illness. Jose Burgess/she was evaluated in the context of the global COVID-19 pandemic, which necessitated consideration that the patient might be at risk for infection with the SARS-CoV-2 virus that causes COVID-19. Institutional protocols and algorithms that pertain to the evaluation of patients at risk for COVID-19 are in a state of rapid change based on information released by regulatory bodies including the CDC and federal and  state organizations. These policies and algorithms were followed during the patient's care in the ED.    10/18/2020 10/16/20 10/18/20, April, MD  10/16/20 0327  

## 2020-10-16 NOTE — Discharge Instructions (Addendum)
You were seen in the emergency department today for trouble breathing.  Your chest x-ray was did not show pneumonia your COVID and flu test were negative.  Your labs were overall reassuring.  Your blood sugar was mildly elevated, please monitor this closely at home.  We are sending home with an albuterol inhaler to use 1 to 2 puffs every 4-6 hours as needed for trouble breathing/wheezing, a Flovent inhaler to use 1 to 2 puffs daily as needed for trouble breathing/wheezing, this is an inhaled steroid, please drink plenty of fluids with this, please monitor your blood sugars closely.  We are also sending home with Zyrtec to take daily as needed for congestion/trouble breathing.  We have prescribed you new medication(s) today. Discuss the medications prescribed today with your pharmacist as they can have adverse effects and interactions with your other medicines including over the counter and prescribed medications. Seek medical evaluation if you start to experience new or abnormal symptoms after taking one of these medicines, seek care immediately if you start to experience difficulty breathing, feeling of your throat closing, facial swelling, or rash as these could be indications of a more serious allergic reaction  Follow-up primary care within 3 days.  Return to the emergency department for new or worsening symptoms or any other concerns.

## 2020-10-17 ENCOUNTER — Encounter: Payer: Self-pay | Admitting: Nurse Practitioner

## 2020-12-13 ENCOUNTER — Other Ambulatory Visit: Payer: Self-pay | Admitting: Nurse Practitioner

## 2020-12-13 MED ORDER — VICTOZA 18 MG/3ML ~~LOC~~ SOPN: 1.8000 mg | PEN_INJECTOR | Freq: Every day | SUBCUTANEOUS | 0 refills | Status: DC

## 2020-12-13 MED ORDER — LISINOPRIL 5 MG PO TABS: ORAL_TABLET | Freq: Every day | ORAL | 0 refills | Status: DC

## 2020-12-13 NOTE — Telephone Encounter (Signed)
CALL TO SCHEDULE OFFICE VISIT. NO additional refills

## 2021-01-28 ENCOUNTER — Other Ambulatory Visit: Payer: Self-pay | Admitting: Nurse Practitioner

## 2021-01-28 MED FILL — Sildenafil Citrate Tab 100 MG: ORAL | Qty: 10 | Fill #0 | Status: CN

## 2021-01-29 ENCOUNTER — Other Ambulatory Visit: Payer: Self-pay

## 2021-01-31 ENCOUNTER — Other Ambulatory Visit: Payer: Self-pay | Admitting: Nurse Practitioner

## 2021-01-31 ENCOUNTER — Encounter: Payer: Self-pay | Admitting: Nurse Practitioner

## 2021-01-31 MED ORDER — INSULIN ISOPHANE & REGULAR (HUMAN 70-30)100 UNIT/ML KWIKPEN: 15.0000 [IU] | PEN_INJECTOR | Freq: Two times a day (BID) | SUBCUTANEOUS | 0 refills | Status: DC

## 2021-02-24 ENCOUNTER — Other Ambulatory Visit: Payer: Self-pay | Admitting: Nurse Practitioner

## 2021-02-24 MED ORDER — NOVOLIN 70/30 FLEXPEN (70-30) 100 UNIT/ML ~~LOC~~ SUPN: 15.0000 [IU] | PEN_INJECTOR | Freq: Two times a day (BID) | SUBCUTANEOUS | 11 refills | Status: DC

## 2021-02-25 NOTE — Telephone Encounter (Signed)
Pcp already addressed

## 2021-03-06 ENCOUNTER — Other Ambulatory Visit: Payer: Self-pay | Admitting: Nurse Practitioner

## 2021-03-06 NOTE — Telephone Encounter (Signed)
Requested Prescriptions  Pending Prescriptions Disp Refills   metFORMIN (GLUCOPHAGE) 500 MG tablet [Pharmacy Med Name: metFORMIN HCl 500 MG Oral Tablet] 180 tablet 0    Sig: TAKE 1 TABLET BY MOUTH TWICE DAILY WITH A MEAL     Endocrinology:  Diabetes - Biguanides Failed - 03/06/2021 10:04 PM      Failed - HBA1C is between 0 and 7.9 and within 180 days    Hgb A1c MFr Bld  Date Value Ref Range Status  07/31/2020 6.0 (H) 4.8 - 5.6 % Final    Comment:             Prediabetes: 5.7 - 6.4          Diabetes: >6.4          Glycemic control for adults with diabetes: <7.0          Failed - B12 Level in normal range and within 720 days    No results found for: VITAMINB12       Failed - Valid encounter within last 6 months    Recent Outpatient Visits          7 months ago Type 2 diabetes mellitus without complication, with long-term current use of insulin ([place])   [place], [place] W, NP   10 months ago Type 2 diabetes mellitus with hyperglycemia, with long-term current use of insulin ([place])   [place], Vernia Buff, NP   1 year ago Encounter to establish care   [place], Vernia Buff, NP      Future Appointments            In 1 month Gildardo Pounds, NP [place] in normal range and within 360 days    Creatinine, Ser  Date Value Ref Range Status  10/16/2020 0.86 0.61 - 1.24 mg/dL Final         Passed - eGFR in normal range and within 360 days    GFR calc Af Amer  Date Value Ref Range Status  06/08/2019 >60 >60 mL/min Final   GFR, Estimated  Date Value Ref Range Status  10/16/2020 >60 >60 mL/min Final    Comment:    (NOTE) Calculated using the CKD-EPI Creatinine Equation (2021)    eGFR  Date Value Ref Range Status  07/31/2020 117 >59 mL/min/1.73 Final         Passed - CBC within normal limits and completed  in the last 12 months    WBC  Date Value Ref Range Status  10/16/2020 12.2 (H) 4.0 - 10.5 K/uL Final   RBC  Date Value Ref Range Status  10/16/2020 4.49 4.22 - 5.81 MIL/uL Final   Hemoglobin  Date Value Ref Range Status  10/16/2020 13.8 13.0 - 17.0 g/dL Final   HCT  Date Value Ref Range Status  10/16/2020 42.6 39.0 - 52.0 % Final   MCHC  Date Value Ref Range Status  10/16/2020 32.4 30.0 - 36.0 g/dL Final   [facility]  Date Value Ref Range Status  10/16/2020 30.7 26.0 - 34.0 pg Final   MCV  Date Value Ref Range Status  10/16/2020 94.9 80.0 - 100.0 fL Final   No results found for: PLTCOUNTKUC, LABPLAT, POCPLA RDW  Date Value Ref Range Status  10/16/2020 12.1 11.5 - 15.5 % Final  Patient must keep upcoming appointment for further refills.

## 2021-03-18 ENCOUNTER — Emergency Department (HOSPITAL_COMMUNITY): Payer: 59

## 2021-03-18 ENCOUNTER — Emergency Department (HOSPITAL_COMMUNITY)
Admission: EM | Admit: 2021-03-18 | Discharge: 2021-03-18 | Disposition: A | Discharge status: Home / Self Care | Payer: 59 | Attending: Student | Admitting: Student

## 2021-03-18 ENCOUNTER — Encounter (HOSPITAL_COMMUNITY): Payer: Self-pay | Admitting: Oncology

## 2021-03-18 ENCOUNTER — Other Ambulatory Visit: Payer: Self-pay

## 2021-03-18 DIAGNOSIS — E119 Type 2 diabetes mellitus without complications: Secondary | ICD-10-CM | POA: Diagnosis not present

## 2021-03-18 DIAGNOSIS — R000 Tachycardia, unspecified: Secondary | ICD-10-CM | POA: Diagnosis not present

## 2021-03-18 DIAGNOSIS — R112 Nausea with vomiting, unspecified: Secondary | ICD-10-CM | POA: Insufficient documentation

## 2021-03-18 DIAGNOSIS — J189 Pneumonia, unspecified organism: Secondary | ICD-10-CM

## 2021-03-18 DIAGNOSIS — R059 Cough, unspecified: Secondary | ICD-10-CM | POA: Diagnosis not present

## 2021-03-18 DIAGNOSIS — R0602 Shortness of breath: Secondary | ICD-10-CM | POA: Diagnosis not present

## 2021-03-18 DIAGNOSIS — Z20822 Contact with and (suspected) exposure to covid-19: Secondary | ICD-10-CM | POA: Diagnosis not present

## 2021-03-18 DIAGNOSIS — R062 Wheezing: Secondary | ICD-10-CM | POA: Insufficient documentation

## 2021-03-18 DIAGNOSIS — Z7984 Long term (current) use of oral hypoglycemic drugs: Secondary | ICD-10-CM | POA: Insufficient documentation

## 2021-03-18 DIAGNOSIS — I10 Essential (primary) hypertension: Secondary | ICD-10-CM | POA: Diagnosis not present

## 2021-03-18 DIAGNOSIS — Z794 Long term (current) use of insulin: Secondary | ICD-10-CM | POA: Diagnosis not present

## 2021-03-18 DIAGNOSIS — R197 Diarrhea, unspecified: Secondary | ICD-10-CM | POA: Diagnosis not present

## 2021-03-18 DIAGNOSIS — R918 Other nonspecific abnormal finding of lung field: Secondary | ICD-10-CM | POA: Diagnosis not present

## 2021-03-18 DIAGNOSIS — D72829 Elevated white blood cell count, unspecified: Secondary | ICD-10-CM | POA: Diagnosis not present

## 2021-03-18 LAB — COMPREHENSIVE METABOLIC PANEL
ALT: 47 U/L — ABNORMAL HIGH (ref 0–44)
AST: 33 U/L (ref 15–41)
Albumin: 4.4 g/dL (ref 3.5–5.0)
Alkaline Phosphatase: 59 U/L (ref 38–126)
Anion gap: 10 (ref 5–15)
BUN: 17 mg/dL (ref 6–20)
CO2: 21 mmol/L — ABNORMAL LOW (ref 22–32)
Calcium: 9.3 mg/dL (ref 8.9–10.3)
Chloride: 105 mmol/L (ref 98–111)
Creatinine, Ser: 1.06 mg/dL (ref 0.61–1.24)
GFR, Estimated: >60 mL/min (ref >60)
Glucose, Bld: 112 mg/dL — ABNORMAL HIGH (ref 70–99)
Potassium: 3.7 mmol/L (ref 3.5–5.1)
Sodium: 136 mmol/L (ref 135–145)
Total Bilirubin: 0.9 mg/dL (ref 0.3–1.2)
Total Protein: 8 g/dL (ref 6.5–8.1)

## 2021-03-18 LAB — CBC WITH DIFFERENTIAL/PLATELET
Abs Immature Granulocytes: 0.08 10*3/uL — ABNORMAL HIGH (ref 0.00–0.07)
Basophils Absolute: 0 10*3/uL (ref 0.0–0.1)
Basophils Relative: 0 %
Eosinophils Absolute: 0 10*3/uL (ref 0.0–0.5)
Eosinophils Relative: 0 %
HCT: 40.3 % (ref 39.0–52.0)
Hemoglobin: 13.2 g/dL (ref 13.0–17.0)
Immature Granulocytes: 1 %
Lymphocytes Relative: 8 %
Lymphs Abs: 1.3 10*3/uL (ref 0.7–4.0)
MCH: 30.5 pg (ref 26.0–34.0)
MCHC: 32.8 g/dL (ref 30.0–36.0)
MCV: 93.1 fL (ref 80.0–100.0)
Monocytes Absolute: 1.3 10*3/uL — ABNORMAL HIGH (ref 0.1–1.0)
Monocytes Relative: 8 %
Neutro Abs: 14.4 10*3/uL — ABNORMAL HIGH (ref 1.7–7.7)
Neutrophils Relative %: 83 %
Platelets: 333 10*3/uL (ref 150–400)
RBC: 4.33 MIL/uL (ref 4.22–5.81)
RDW: 11.7 % (ref 11.5–15.5)
WBC: 17.2 10*3/uL — ABNORMAL HIGH (ref 4.0–10.5)
nRBC: 0 % (ref 0.0–0.2)

## 2021-03-18 LAB — RESP PANEL BY RT-PCR (FLU A&B, COVID) ARPGX2
Influenza A by PCR: NEGATIVE
Influenza B by PCR: NEGATIVE
SARS Coronavirus 2 by RT PCR: NEGATIVE

## 2021-03-18 LAB — TROPONIN I (HIGH SENSITIVITY): Troponin I (High Sensitivity): 3 ng/L (ref <18)

## 2021-03-18 MED ORDER — ONDANSETRON 4 MG PO TBDP
4.0000 mg | ORAL_TABLET | Freq: Once | ORAL | Status: AC
  Administered 2021-03-18: 4 mg via ORAL
  Filled 2021-03-18: qty 1

## 2021-03-18 MED ORDER — AMOXICILLIN-POT CLAVULANATE 875-125 MG PO TABS: 1.0000 | ORAL_TABLET | Freq: Two times a day (BID) | ORAL | 0 refills | Status: DC

## 2021-03-18 MED ORDER — ACETAMINOPHEN 500 MG PO TABS
1000.0000 mg | ORAL_TABLET | Freq: Once | ORAL | Status: AC
  Administered 2021-03-18: 1000 mg via ORAL
  Filled 2021-03-18: qty 2

## 2021-03-18 MED ORDER — AMOXICILLIN-POT CLAVULANATE 875-125 MG PO TABS
1.0000 | ORAL_TABLET | Freq: Once | ORAL | Status: AC
  Administered 2021-03-18: 1 via ORAL
  Filled 2021-03-18: qty 1

## 2021-03-18 NOTE — ED Triage Notes (Signed)
Pt reports productive cough, fever, chills, generalized body aches, abdominal pain and N/V x one week.

## 2021-03-18 NOTE — ED Provider Notes (Signed)
[facility]-EMERGENCY DEPT Provider Note  CSN: [id number] Arrival date & time: 03/18/21 1751  Chief Complaint(s) Cough  HPI Jose Burgess is a 36 y.o. male with PMH vitiligo, diabetes, HTN who presents emergency department for evaluation of cough and shortness of breath.  Patient states that Jose Burgess has had flulike symptoms over the last 1 week and there are multiple sick contacts at home.  Jose Burgess states that Jose Burgess initially was feeling better over the course this week but over the last 48 hours has felt significantly worse with body aches, shortness of breath and persistent cough.  Jose Burgess also endorses vomiting and diarrhea.  States Jose Burgess has had decreased p.o. intake.  Denies chest pain, abdominal pain, headache or other systemic symptoms.  Patient arrives febrile and tachycardic.   Cough Associated symptoms: shortness of breath    Past Medical History Past Medical History:  Diagnosis Date   Anxiety    Depression    Diabetes 1.5, managed as type 2 (HCC)    Hypertension    Obese    Patient Active Problem List   Diagnosis Date Noted   Hypokalemia    Diabetes mellitus with nonketotic hyperosmolarity (HCC) 04/01/2020   Anxiety with depression 04/01/2020   Hypertension 04/01/2020   Class 3 severe obesity due to excess calories with body mass index (BMI) of 40.0 to 44.9 in adult [facility]) 11/25/2017   Vitiligo 11/25/2017   Home Medication(s) Prior to Admission medications   Medication Sig Start Date End Date Taking? Authorizing Provider  cetirizine (ZYRTEC ALLERGY) 10 MG tablet Take 1 tablet (10 mg total) by mouth daily as needed for allergies or rhinitis. 10/16/20   Petrucelli, Samantha R, PA-C  escitalopram (LEXAPRO) 10 MG tablet TAKE 1 TABLET BY MOUTH ONCE A DAY 05/15/20 05/15/21  Claiborne Rigg, NP  fluticasone (FLOVENT HFA) 44 MCG/ACT inhaler Inhale 2 puffs into the lungs daily as needed (shortness of breath). 10/16/20   Petrucelli, Samantha R, PA-C  insulin isophane & regular  human KwikPen (NOVOLIN 70/30 KWIKPEN) (70-30) 100 UNIT/ML KwikPen Inject 15 Units into the skin 2 (two) times daily. 02/24/21   Claiborne Rigg, NP  Insulin Pen Needle 31G X 5 MM MISC Use as instructed. Inject into the skin once daily 05/15/20   Claiborne Rigg, NP  liraglutide (VICTOZA) 18 MG/3ML SOPN Inject 1.8 mg into the skin daily. NEEDS OFFICE VISIT. 12/13/20 03/13/21  Claiborne Rigg, NP  lisinopril (ZESTRIL) 5 MG tablet TAKE 1 TABLET (5 MG TOTAL) BY MOUTH DAILY. 12/13/20 03/13/21  Claiborne Rigg, NP  metFORMIN (GLUCOPHAGE) 500 MG tablet TAKE 1 TABLET BY MOUTH TWICE DAILY WITH A MEAL 03/06/21   Claiborne Rigg, NP  sildenafil (VIAGRA) 100 MG tablet TAKE 0.5-1 TABLETS (50-100 MG TOTAL) BY MOUTH DAILY AS NEEDED FOR ERECTILE DYSFUNCTION. 04/15/20 04/15/21  Claiborne Rigg, NP  Past Surgical History Past Surgical History:  Procedure Laterality Date   NO PAST SURGERIES     Family History Family History  Problem Relation Age of Onset   Anxiety disorder Mother    Hyperlipidemia Mother    Diabetes Mother    Heart disease Father    Hyperlipidemia Father    Diabetes Father     Social History Social History   Tobacco Use   Smoking status: Never   Smokeless tobacco: Never  Substance Use Topics   Alcohol use: Yes    Comment: Occasionally    Drug use: Not Currently   Allergies Patient has no known allergies.  Review of Systems Review of Systems  Constitutional:  Positive for fatigue.  Respiratory:  Positive for cough and shortness of breath.   Gastrointestinal:  Positive for nausea and vomiting.   Physical Exam Vital Signs  I have reviewed the triage vital signs BP (!) 155/79 (BP Location: Left Arm)    Pulse (!) 117    Temp (!) 101.7 F (38.7 C) (Oral)    Resp 18    Ht 5\' 10"  (1.778 m)    Wt 136.1 kg    SpO2 96%    BMI 43.05 kg/m   Physical  Exam Vitals and nursing note reviewed.  Constitutional:      General: Jose Burgess is not in acute distress.    Appearance: Jose Burgess is well-developed.  HENT:     Head: Normocephalic and atraumatic.  Eyes:     Conjunctiva/sclera: Conjunctivae normal.  Cardiovascular:     Rate and Rhythm: Normal rate and regular rhythm.     Heart sounds: No murmur heard. Pulmonary:     Effort: Pulmonary effort is normal. No respiratory distress.     Breath sounds: Wheezing present.  Abdominal:     Palpations: Abdomen is soft.     Tenderness: There is no abdominal tenderness.  Musculoskeletal:        General: No swelling.     Cervical back: Neck supple.  Skin:    General: Skin is warm and dry.     Capillary Refill: Capillary refill takes less than 2 seconds.  Neurological:     Mental Status: Jose Burgess is alert.  Psychiatric:        Mood and Affect: Mood normal.    ED Results and Treatments Labs (all labs ordered are listed, but only abnormal results are displayed) Labs Reviewed  RESP PANEL BY RT-PCR (FLU A&B, COVID) ARPGX2  COMPREHENSIVE METABOLIC PANEL  CBC WITH DIFFERENTIAL/PLATELET  TROPONIN I (HIGH SENSITIVITY)                                                                                                                          Radiology No results found.  Pertinent labs & imaging results that were available during my care of the patient were reviewed by me and considered in my medical decision making (see MDM for details).  Medications Ordered in ED Medications  ondansetron (ZOFRAN-ODT)  disintegrating tablet 4 mg (has no administration in time range)  acetaminophen (TYLENOL) tablet 1,000 mg (has no administration in time range)                                                                                                                                     Procedures Procedures  (including critical care time)  Medical Decision Making / ED Course   This patient presents to the ED for concern  of cough, shortness of breath, this involves an extensive number of treatment options, and is a complaint that carries with it a high risk of complications and morbidity.  The differential diagnosis includes pneumonia, COVID-19, influenza, PE, ACS  MDM: Patient seen Emergency Department for evaluation of shortness of breath and cough.  Physical exam with faint wheezes in the bilateral bases.  Laboratory evaluation with a significant leukocytosis to 17.2 but is otherwise unremarkable.  High-sensitivity troponin negative.  ECG with sinus tachycardia but nonischemic.  Chest x-ray concerning for pneumonia.  Patient given first dose of Augmentin here in the emergency department and fever controlled with Tylenol.  On reevaluation, fever trending in the appropriate direction and the patient I had a long discussion about his persistent tachycardia.  His tachycardia can be explained by his active pneumonia which we will treat outpatient and should continue to improve.  However, Jose Burgess was given strict return precautions which Jose Burgess voiced understanding and Jose Burgess was able to ambulate without difficulty and no shortness of breath here in the emergency department.  Patient then discharged with primary care follow-up and a prescription for Augmentin   Additional history obtained:  -External records from outside source obtained and reviewed including: Chart review including previous notes, labs, imaging, consultation notes   Lab Tests: -I ordered, reviewed, and interpreted labs.   The pertinent results include:   Labs Reviewed  RESP PANEL BY RT-PCR (FLU A&B, COVID) ARPGX2  COMPREHENSIVE METABOLIC PANEL  CBC WITH DIFFERENTIAL/PLATELET  TROPONIN I (HIGH SENSITIVITY)      EKG   EKG Interpretation  Date/Time:  Tuesday March 18 2021 19:58:10 EST Ventricular Rate:  116 PR Interval:  124 QRS Duration: 105 QT Interval:  311 QTC Calculation: 432 R Axis:   55 Text Interpretation: Sinus tachycardia Confirmed by  Lenzi Marmo ([id number]) on 03/18/2021 8:11:08 PM         Imaging Studies ordered: I ordered imaging studies including CXR I independently visualized and interpreted imaging. I agree with the radiologist interpretation   Medicines ordered and prescription drug management: Meds ordered this encounter  Medications   ondansetron (ZOFRAN-ODT) disintegrating tablet 4 mg   acetaminophen (TYLENOL) tablet 1,000 mg    -I have reviewed the patients home medicines and have made adjustments as needed  Critical interventions none  Cardiac Monitoring: The patient was maintained on a cardiac monitor.  I personally viewed and interpreted the cardiac monitored which showed an underlying rhythm of: Sinus tachycardia  Social Determinants of Health:  Factors impacting patients care include: none   Reevaluation: After the interventions noted above, I reevaluated the patient and found that they have :improved  Co morbidities that complicate the patient evaluation  Past Medical History:  Diagnosis Date   Anxiety    Depression    Diabetes 1.5, managed as type 2 (HCC)    Hypertension    Obese       Dispostion: I considered admission for this patient, but patient's pneumonia can be treated outside the hospital Jose Burgess is able to ambulate without difficulty and is not requiring supplemental oxygen here in the emergency department.  Using shared decision-making, we decided to send the patient home with close outpatient follow-up.     Final Clinical Impression(s) / ED Diagnoses Final diagnoses:  None     @PCDICTATION @    , MD 03/18/21 2233

## 2021-03-24 ENCOUNTER — Other Ambulatory Visit: Payer: Self-pay | Admitting: Nurse Practitioner

## 2021-03-31 ENCOUNTER — Other Ambulatory Visit: Payer: Self-pay

## 2021-03-31 ENCOUNTER — Ambulatory Visit: Payer: 59 | Admitting: Podiatry

## 2021-03-31 ENCOUNTER — Ambulatory Visit (INDEPENDENT_AMBULATORY_CARE_PROVIDER_SITE_OTHER): Payer: No Typology Code available for payment source

## 2021-03-31 ENCOUNTER — Ambulatory Visit (INDEPENDENT_AMBULATORY_CARE_PROVIDER_SITE_OTHER): Payer: No Typology Code available for payment source | Admitting: Podiatry

## 2021-03-31 DIAGNOSIS — M722 Plantar fascial fibromatosis: Secondary | ICD-10-CM

## 2021-03-31 MED ORDER — DICLOFENAC SODIUM 75 MG PO TBEC: 75.0000 mg | DELAYED_RELEASE_TABLET | Freq: Two times a day (BID) | ORAL | 1 refills | Status: DC

## 2021-03-31 MED ORDER — MELOXICAM 15 MG PO TABS: 15.0000 mg | ORAL_TABLET | Freq: Every day | ORAL | 1 refills | Status: DC

## 2021-03-31 MED ORDER — BETAMETHASONE SOD PHOS & ACET 6 (3-3) MG/ML IJ SUSP
3.0000 mg | Freq: Once | INTRAMUSCULAR | Status: AC
  Administered 2021-03-31: 3 mg via INTRA_ARTICULAR

## 2021-03-31 MED ORDER — METHYLPREDNISOLONE 4 MG PO TBPK: ORAL_TABLET | ORAL | 0 refills | Status: DC

## 2021-03-31 NOTE — Progress Notes (Signed)
? ?  Subjective: ?36 y.o. male presenting as a new patient for evaluation of right heel pain has been going on for about 2 weeks now.  Patient denies a history of injury or change in activity.  He says that the pain has increased over the past week.  He presents for further treatment and evaluation ? ? ?Past Medical History:  ?Diagnosis Date  ? Anxiety   ? Depression   ? Diabetes 1.5, managed as type 2 ([place])   ? Hypertension   ? Obese   ? ?Past Surgical History:  ?Procedure Laterality Date  ? NO PAST SURGERIES    ? ?No Known Allergies ? ? ?Objective: ?Physical Exam ?General: The patient is alert and oriented x3 in no acute distress. ? ?Dermatology: Skin is warm, dry and supple bilateral lower extremities. Negative for open lesions or macerations bilateral.  ? ?Vascular: Dorsalis Pedis and Posterior Tibial pulses palpable bilateral.  Capillary fill time is immediate to all digits. ? ?Neurological: Epicritic and protective threshold intact bilateral.  ? ?Musculoskeletal: Tenderness to palpation to the plantar aspect of the right heel along the plantar fascia. All other joints range of motion within normal limits bilateral. Strength 5/5 in all groups bilateral.  Collapse of the medial longitudinal arch consistent with pes planus deformity ? ?Radiographic exam: ?Normal osseous mineralization. Joint spaces preserved. No fracture/dislocation/boney destruction. No other soft tissue abnormalities or radiopaque foreign bodies.  Pes planovalgus deformity also noted with some degenerative changes throughout the midtarsal joints ? ?Assessment: ?1. Plantar fasciitis right ? ?Plan of Care:  ?1. Patient evaluated. Xrays reviewed.   ?2. Injection of 0.5cc Celestone soluspan injected into the right plantar fascia  ?3.  Continue OTC super feet insoles with new balance walking shoes ?4. Rx for diclofenac 75 mg ordered for patient. ?5. Plantar fascial band(s) dispensed ?6. Instructed patient regarding therapies and modalities at home to  alleviate symptoms.  ?7. Return to clinic in 4 weeks.   ? 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 ? ?Edrick Kins, DPM ?[place] ? ?Dr. Edrick Kins, DPM  ?  ?[address]                                        ?[place], [place] [address]                ?Office [phone]  ?Fax [phone] ? ? ?  ? ?

## 2021-04-07 ENCOUNTER — Ambulatory Visit: Payer: 59 | Admitting: Nurse Practitioner

## 2021-04-14 ENCOUNTER — Ambulatory Visit: Payer: No Typology Code available for payment source | Admitting: Nurse Practitioner

## 2021-04-28 ENCOUNTER — Ambulatory Visit: Payer: No Typology Code available for payment source | Admitting: Podiatry

## 2021-05-16 ENCOUNTER — Ambulatory Visit: Payer: 59 | Admitting: Nurse Practitioner

## 2021-06-14 ENCOUNTER — Other Ambulatory Visit: Payer: Self-pay | Admitting: Nurse Practitioner

## 2021-06-14 ENCOUNTER — Other Ambulatory Visit: Payer: Self-pay | Admitting: Family Medicine

## 2021-06-14 ENCOUNTER — Encounter: Payer: Self-pay | Admitting: Nurse Practitioner

## 2021-06-16 ENCOUNTER — Other Ambulatory Visit: Payer: Self-pay | Admitting: Nurse Practitioner

## 2021-06-16 MED ORDER — METFORMIN HCL 500 MG PO TABS: 500.0000 mg | ORAL_TABLET | Freq: Two times a day (BID) | ORAL | 0 refills | Status: DC

## 2021-06-16 MED ORDER — LISINOPRIL 5 MG PO TABS: 5.0000 mg | ORAL_TABLET | Freq: Every day | ORAL | 0 refills | Status: DC

## 2021-06-16 NOTE — Telephone Encounter (Signed)
Filled today

## 2021-06-16 NOTE — Telephone Encounter (Signed)
Filled today. Requested Prescriptions  Refused Prescriptions Disp Refills  . metFORMIN (GLUCOPHAGE) 500 MG tablet [Pharmacy Med Name: metFORMIN HCl 500 MG Oral Tablet] 180 tablet 0    Sig: TAKE 1 TABLET BY MOUTH TWICE DAILY WITH A MEAL MUST  KEEP  UPCOMING  APPOINTMENT  FOR  REFILLS     Endocrinology:  Diabetes - Biguanides Failed - 06/14/2021 11:09 AM      Failed - HBA1C is between 0 and 7.9 and within 180 days    Hgb A1c MFr Bld  Date Value Ref Range Status  07/31/2020 6.0 (H) 4.8 - 5.6 % Final    Comment:             Prediabetes: 5.7 - 6.4          Diabetes: >6.4          Glycemic control for adults with diabetes: <7.0          Failed - B12 Level in normal range and within 720 days    No results found for: VITAMINB12       Failed - Valid encounter within last 6 months    Recent Outpatient Visits          11 months ago Type 2 diabetes mellitus without complication, with long-term current use of insulin ([place])   [place], Vernia Buff, NP   1 year ago Type 2 diabetes mellitus with hyperglycemia, with long-term current use of insulin ([place])   [place], [place] W, NP   1 year ago Encounter to establish care   [place], Vernia Buff, NP      Future Appointments            In 4 days Gildardo Pounds, NP [place] in normal range and within 360 days    Creatinine, Ser  Date Value Ref Range Status  03/18/2021 1.06 0.61 - 1.24 mg/dL Final         Passed - eGFR in normal range and within 360 days    GFR calc Af Amer  Date Value Ref Range Status  06/08/2019 >60 >60 mL/min Final   GFR, Estimated  Date Value Ref Range Status  03/18/2021 >60 >60 mL/min Final    Comment:    (NOTE) Calculated using the CKD-EPI Creatinine Equation (2021)    eGFR  Date Value Ref Range Status  07/31/2020 117 >59 mL/min/1.73 Final          Passed - CBC within normal limits and completed in the last 12 months    WBC  Date Value Ref Range Status  03/18/2021 17.2 (H) 4.0 - 10.5 K/uL Final   RBC  Date Value Ref Range Status  03/18/2021 4.33 4.22 - 5.81 MIL/uL Final   Hemoglobin  Date Value Ref Range Status  03/18/2021 13.2 13.0 - 17.0 g/dL Final   HCT  Date Value Ref Range Status  03/18/2021 40.3 39.0 - 52.0 % Final   MCHC  Date Value Ref Range Status  03/18/2021 32.8 30.0 - 36.0 g/dL Final   [facility]  Date Value Ref Range Status  03/18/2021 30.5 26.0 - 34.0 pg Final   MCV  Date Value Ref Range Status  03/18/2021 93.1 80.0 - 100.0 fL Final   No results found for: PLTCOUNTKUC, LABPLAT, POCPLA RDW  Date Value Ref  Range Status  03/18/2021 11.7 11.5 - 15.5 % Final

## 2021-06-20 ENCOUNTER — Encounter: Payer: Self-pay | Admitting: Nurse Practitioner

## 2021-06-20 ENCOUNTER — Ambulatory Visit: Payer: No Typology Code available for payment source | Attending: Nurse Practitioner | Admitting: Nurse Practitioner

## 2021-06-20 VITALS — BP 142/84 | HR 89 | Wt 289.4 lb

## 2021-06-20 DIAGNOSIS — F418 Other specified anxiety disorders: Secondary | ICD-10-CM

## 2021-06-20 DIAGNOSIS — R69 Illness, unspecified: Secondary | ICD-10-CM | POA: Diagnosis not present

## 2021-06-20 DIAGNOSIS — E119 Type 2 diabetes mellitus without complications: Secondary | ICD-10-CM

## 2021-06-20 DIAGNOSIS — E785 Hyperlipidemia, unspecified: Secondary | ICD-10-CM

## 2021-06-20 DIAGNOSIS — Z794 Long term (current) use of insulin: Secondary | ICD-10-CM | POA: Diagnosis not present

## 2021-06-20 DIAGNOSIS — N529 Male erectile dysfunction, unspecified: Secondary | ICD-10-CM | POA: Diagnosis not present

## 2021-06-20 LAB — POCT GLYCOSYLATED HEMOGLOBIN (HGB A1C): HbA1c, POC (controlled diabetic range): 5.6 % (ref 0.0–7.0)

## 2021-06-20 LAB — GLUCOSE, POCT (MANUAL RESULT ENTRY): POC Glucose: 70 mg/dl (ref 70–99)

## 2021-06-20 MED ORDER — SERTRALINE HCL 25 MG PO TABS: 25.0000 mg | ORAL_TABLET | Freq: Every day | ORAL | 3 refills | Status: DC

## 2021-06-20 MED ORDER — INSULIN PEN NEEDLE 31G X 5 MM MISC: 6 refills | Status: DC

## 2021-06-20 MED ORDER — LISINOPRIL 5 MG PO TABS: 5.0000 mg | ORAL_TABLET | Freq: Every day | ORAL | 1 refills | Status: DC

## 2021-06-20 MED ORDER — METFORMIN HCL 500 MG PO TABS: 500.0000 mg | ORAL_TABLET | Freq: Two times a day (BID) | ORAL | 1 refills | Status: DC

## 2021-06-20 MED ORDER — ATORVASTATIN CALCIUM 20 MG PO TABS
20.0000 mg | ORAL_TABLET | Freq: Every day | ORAL | 3 refills | Status: DC
Start: 2021-06-20 — End: 2022-01-27

## 2021-06-20 MED ORDER — ESCITALOPRAM OXALATE 10 MG PO TABS: 10.0000 mg | ORAL_TABLET | Freq: Every day | ORAL | 1 refills | Status: DC

## 2021-06-20 MED ORDER — VICTOZA 18 MG/3ML ~~LOC~~ SOPN: 1.8000 mg | PEN_INJECTOR | Freq: Every day | SUBCUTANEOUS | 1 refills | Status: DC

## 2021-06-20 MED ORDER — SILDENAFIL CITRATE 100 MG PO TABS: ORAL_TABLET | ORAL | 11 refills | Status: DC

## 2021-06-20 MED ORDER — NOVOLIN 70/30 FLEXPEN (70-30) 100 UNIT/ML ~~LOC~~ SUPN: 10.0000 [IU] | PEN_INJECTOR | Freq: Two times a day (BID) | SUBCUTANEOUS | 11 refills | Status: DC

## 2021-06-20 NOTE — Progress Notes (Signed)
Assessment & Plan:  Jose Burgess was seen today for hypertension and diabetes.  Diagnoses and all orders for this visit:  Type 2 diabetes mellitus without complication, with long-term current use of insulin Decreased 70/30 today -     POCT glucose (manual entry) -     POCT glycosylated hemoglobin (Hb A1C) -     Insulin Pen Needle 31G X 5 MM MISC; Use as instructed. Inject into the skin once daily -     liraglutide (VICTOZA) 18 MG/3ML SOPN; Inject 1.8 mg into the skin daily. -     lisinopril (ZESTRIL) 5 MG tablet; Take 1 tablet (5 mg total) by mouth daily. -     metFORMIN (GLUCOPHAGE) 500 MG tablet; Take 1 tablet (500 mg total) by mouth 2 (two) times daily with a meal. -     insulin isophane & regular human KwikPen (NOVOLIN 70/30 KWIKPEN) (70-30) 100 UNIT/ML KwikPen; Inject 10 Units into the skin 2 (two) times daily. -     Basic metabolic panel  Anxiety with depression -     Ambulatory referral to Integrated Behavioral Health -     sertraline (ZOLOFT) 25 MG tablet; Take 1 tablet (25 mg total) by mouth daily.  Vasculogenic erectile dysfunction, unspecified vasculogenic erectile dysfunction type -     sildenafil (VIAGRA) 100 MG tablet; TAKE 0.5-1 TABLETS (50-100 MG TOTAL) BY MOUTH DAILY AS NEEDED FOR ERECTILE DYSFUNCTION.     Patient has been counseled on age-appropriate routine health concerns for screening and prevention. These are reviewed and up-to-date. Referrals have been placed accordingly. Immunizations are up-to-date or declined.    Subjective:   Chief Complaint  Patient presents with   Hypertension   Diabetes   HPI Jose Burgess 36 y.o. male presents to office today for follow up to DM and HTN He has a past medical history of Anxiety, Depression, Diabetes 1.5, managed as type 2, Vitiligo, Hypertension, and Morbid obesity    DM2 Diabetes is well controlled. Notes average blood glucose readings: 70-120s.  Checking blood sugars fasting, lunchtime and after dinner in the  evenings. Will decrease 70/30 from 15 units BID to 10 units BID. He will continue on victoza 1.8 mg daily and metformin 500 mg BID. LDL not at goal. Will start low dose cholesterol medication today.  Lab Results  Component Value Date   HGBA1C 5.6 06/20/2021    Lab Results  Component Value Date   LDLCALC 96 07/31/2020     HTN  Blood pressure is elevated today. He does endorse adherence taking lisinopril 5 mg daily. BP Readings from Last 3 Encounters:  06/20/21 (!) 142/84  03/18/21 122/72  10/16/20 120/80     Anxiety and Depression  Requesting referral today for psychotherapy. He was previously prescribed lexapro but states it made him have frequent loose stools so he stopped taking it. We will try sertraline today.   Review of Systems  Constitutional:  Negative for fever, malaise/fatigue and weight loss.  HENT: Negative.  Negative for nosebleeds.   Eyes: Negative.  Negative for blurred vision, double vision and photophobia.  Respiratory: Negative.  Negative for cough and shortness of breath.   Cardiovascular: Negative.  Negative for chest pain, palpitations and leg swelling.  Gastrointestinal: Negative.  Negative for heartburn, nausea and vomiting.  Genitourinary:        ED  Musculoskeletal: Negative.  Negative for myalgias.  Neurological: Negative.  Negative for dizziness, focal weakness, seizures and headaches.  Psychiatric/Behavioral:  Positive for depression. Negative for suicidal  ideas. The patient is nervous/anxious.    Past Medical History:  Diagnosis Date   Anxiety    Depression    Diabetes 1.5, managed as type 2 (HCC)    Hypertension    Obese     Past Surgical History:  Procedure Laterality Date   NO PAST SURGERIES      Family History  Problem Relation Age of Onset   Anxiety disorder Mother    Hyperlipidemia Mother    Diabetes Mother    Heart disease Father    Hyperlipidemia Father    Diabetes Father     Social History Reviewed with no changes to be  made today.   Outpatient Medications Prior to Visit  Medication Sig Dispense Refill   cetirizine (ZYRTEC ALLERGY) 10 MG tablet Take 1 tablet (10 mg total) by mouth daily as needed for allergies or rhinitis. 30 tablet 0   diclofenac (VOLTAREN) 75 MG EC tablet Take 1 tablet (75 mg total) by mouth 2 (two) times daily. 60 tablet 1   fluticasone (FLOVENT HFA) 44 MCG/ACT inhaler Inhale 2 puffs into the lungs daily as needed (shortness of breath). 1 each 0   insulin isophane & regular human KwikPen (NOVOLIN 70/30 KWIKPEN) (70-30) 100 UNIT/ML KwikPen Inject 15 Units into the skin 2 (two) times daily. 15 mL 11   Insulin Pen Needle 31G X 5 MM MISC Use as instructed. Inject into the skin once daily 100 each 6   lisinopril (ZESTRIL) 5 MG tablet Take 1 tablet (5 mg total) by mouth daily for 5 days. 5 tablet 0   metFORMIN (GLUCOPHAGE) 500 MG tablet Take 1 tablet (500 mg total) by mouth 2 (two) times daily with a meal for 5 days. 10 tablet 0   amoxicillin-clavulanate (AUGMENTIN) 875-125 MG tablet Take 1 tablet by mouth every 12 (twelve) hours. 14 tablet 0   escitalopram (LEXAPRO) 10 MG tablet TAKE 1 TABLET BY MOUTH ONCE A DAY 30 tablet 2   liraglutide (VICTOZA) 18 MG/3ML SOPN Inject 1.8 mg into the skin daily. NEEDS OFFICE VISIT. 27 mL 0   sildenafil (VIAGRA) 100 MG tablet TAKE 0.5-1 TABLETS (50-100 MG TOTAL) BY MOUTH DAILY AS NEEDED FOR ERECTILE DYSFUNCTION. 10 tablet 11   No facility-administered medications prior to visit.    No Known Allergies     Objective:    BP (!) 142/84   Pulse 89   Wt 289 lb 6.4 oz (131.3 kg)   SpO2 99%   BMI 41.52 kg/m  Wt Readings from Last 3 Encounters:  06/20/21 289 lb 6.4 oz (131.3 kg)  03/18/21 300 lb (136.1 kg)  10/15/20 300 lb (136.1 kg)    Physical Exam Vitals and nursing note reviewed.  Constitutional:      Appearance: He is well-developed.  HENT:     Head: Normocephalic and atraumatic.  Cardiovascular:     Rate and Rhythm: Normal rate and regular  rhythm.     Heart sounds: Normal heart sounds. No murmur heard.   No friction rub. No gallop.  Pulmonary:     Effort: Pulmonary effort is normal. No tachypnea or respiratory distress.     Breath sounds: Normal breath sounds. No decreased breath sounds, wheezing, rhonchi or rales.  Chest:     Chest wall: No tenderness.  Abdominal:     General: Bowel sounds are normal.     Palpations: Abdomen is soft.  Musculoskeletal:        General: Normal range of motion.     Cervical back:  Normal range of motion.  Skin:    General: Skin is warm and dry.  Neurological:     Mental Status: He is alert and oriented to person, place, and time.     Coordination: Coordination normal.  Psychiatric:        Behavior: Behavior normal. Behavior is cooperative.        Thought Content: Thought content normal.        Judgment: Judgment normal.         Patient has been counseled extensively about nutrition and exercise as well as the importance of adherence with medications and regular follow-up. The patient was given clear instructions to go to ER or return to medical center if symptoms don't improve, worsen or new problems develop. The patient verbalized understanding.   Follow-up: Return in about 3 months (around 09/20/2021).   Claiborne RiggZelda W Jillann Charette, FNP-BC [facility] [place], [place] [phone]   06/20/2021, 6:52 PM

## 2021-06-20 NOTE — Progress Notes (Signed)
5.6  

## 2021-06-21 LAB — BASIC METABOLIC PANEL
BUN/Creatinine Ratio: 17 (ref 9–20)
BUN: 17 mg/dL (ref 6–20)
CO2: 24 mmol/L (ref 20–29)
Calcium: 10 mg/dL (ref 8.7–10.2)
Chloride: 102 mmol/L (ref 96–106)
Creatinine, Ser: 1 mg/dL (ref 0.76–1.27)
Glucose: 65 mg/dL — ABNORMAL LOW (ref 70–99)
Potassium: 4.1 mmol/L (ref 3.5–5.2)
Sodium: 142 mmol/L (ref 134–144)
eGFR: 101 mL/min/{1.73_m2} (ref >59)

## 2021-09-12 ENCOUNTER — Encounter: Payer: Self-pay | Admitting: Nurse Practitioner

## 2021-09-16 ENCOUNTER — Other Ambulatory Visit: Payer: Self-pay | Admitting: Nurse Practitioner

## 2021-09-16 DIAGNOSIS — L80 Vitiligo: Secondary | ICD-10-CM

## 2021-11-04 ENCOUNTER — Other Ambulatory Visit: Payer: Self-pay | Admitting: Nurse Practitioner

## 2021-11-04 DIAGNOSIS — E119 Type 2 diabetes mellitus without complications: Secondary | ICD-10-CM

## 2021-12-06 ENCOUNTER — Other Ambulatory Visit: Payer: Self-pay | Admitting: Nurse Practitioner

## 2021-12-06 DIAGNOSIS — E119 Type 2 diabetes mellitus without complications: Secondary | ICD-10-CM

## 2021-12-08 NOTE — Telephone Encounter (Signed)
Requested Prescriptions  Pending Prescriptions Disp Refills   lisinopril (ZESTRIL) 5 MG tablet [Pharmacy Med Name: LISINOPRIL 5 MG TABLET] 90 tablet 1    Sig: TAKE 1 TABLET BY MOUTH ONCE DAILY     Cardiovascular:  ACE Inhibitors Failed - 12/06/2021  9:01 AM      Failed - Last BP in normal range    BP Readings from Last 1 Encounters:  06/20/21 (!) 142/84         Passed - Cr in normal range and within 180 days    Creatinine, Ser  Date Value Ref Range Status  06/20/2021 1.00 0.76 - 1.27 mg/dL Final         Passed - K in normal range and within 180 days    Potassium  Date Value Ref Range Status  06/20/2021 4.1 3.5 - 5.2 mmol/L Final         Passed - Patient is not pregnant      Passed - Valid encounter within last 6 months    Recent Outpatient Visits           5 months ago Type 2 diabetes mellitus without complication, with long-term current use of insulin ([facility])   [facility] [place], [place] W, NP   1 year ago Type 2 diabetes mellitus without complication, with long-term current use of insulin [facility])   [place] [facility] [place], [place] W, NP   1 year ago Type 2 diabetes mellitus with hyperglycemia, with long-term current use of insulin [facility])   [facility] [place], Shea Stakes, NP   2 years ago Encounter to establish care   [facility] [place], Shea Stakes, NP

## 2021-12-08 NOTE — Telephone Encounter (Signed)
Patient called, left VM to return the call to the office to scheduled an appt for medication refill request.   

## 2021-12-25 ENCOUNTER — Other Ambulatory Visit: Payer: Self-pay | Admitting: Nurse Practitioner

## 2021-12-25 DIAGNOSIS — E119 Type 2 diabetes mellitus without complications: Secondary | ICD-10-CM

## 2021-12-25 NOTE — Telephone Encounter (Signed)
Courtesy refill. Patient will need an office visit for further refills. Requested Prescriptions  Pending Prescriptions Disp Refills   metFORMIN (GLUCOPHAGE) 500 MG tablet [Pharmacy Med Name: METFORMIN HCL 500 MG TABLET] 60 tablet 0    Sig: TAKE 1 TABLET BY MOUTH TWICE A DAY     Endocrinology:  Diabetes - Biguanides Failed - 12/25/2021 11:16 AM      Failed - HBA1C is between 0 and 7.9 and within 180 days    HbA1c, POC (controlled diabetic range)  Date Value Ref Range Status  06/20/2021 5.6 0.0 - 7.0 % Final         Failed - B12 Level in normal range and within 720 days    No results found for: "VITAMINB12"       Failed - Valid encounter within last 6 months    Recent Outpatient Visits           6 months ago Type 2 diabetes mellitus without complication, with long-term current use of insulin ([place])   [place], Vernia Buff, NP   1 year ago Type 2 diabetes mellitus without complication, with long-term current use of insulin ([place])   [place], Vernia Buff, NP   1 year ago Type 2 diabetes mellitus with hyperglycemia, with long-term current use of insulin ([place])   [place], [place] W, NP   2 years ago Encounter to establish care   [place], [place] W, NP              Passed - Cr in normal range and within 360 days    Creatinine, Ser  Date Value Ref Range Status  06/20/2021 1.00 0.76 - 1.27 mg/dL Final         Passed - eGFR in normal range and within 360 days    GFR calc Af Amer  Date Value Ref Range Status  06/08/2019 >60 >60 mL/min Final   GFR, Estimated  Date Value Ref Range Status  03/18/2021 >60 >60 mL/min Final    Comment:    (NOTE) Calculated using the CKD-EPI Creatinine Equation (2021)    eGFR  Date Value Ref Range Status  06/20/2021 101 >59 mL/min/1.73 Final         Passed - CBC within normal limits and  completed in the last 12 months    WBC  Date Value Ref Range Status  03/18/2021 17.2 (H) 4.0 - 10.5 K/uL Final   RBC  Date Value Ref Range Status  03/18/2021 4.33 4.22 - 5.81 MIL/uL Final   Hemoglobin  Date Value Ref Range Status  03/18/2021 13.2 13.0 - 17.0 g/dL Final   HCT  Date Value Ref Range Status  03/18/2021 40.3 39.0 - 52.0 % Final   MCHC  Date Value Ref Range Status  03/18/2021 32.8 30.0 - 36.0 g/dL Final   [facility]  Date Value Ref Range Status  03/18/2021 30.5 26.0 - 34.0 pg Final   MCV  Date Value Ref Range Status  03/18/2021 93.1 80.0 - 100.0 fL Final   No results found for: "PLTCOUNTKUC", "LABPLAT", "POCPLA" RDW  Date Value Ref Range Status  03/18/2021 11.7 11.5 - 15.5 % Final

## 2021-12-25 NOTE — Telephone Encounter (Signed)
Requested medication (s) are due for refill today: review  Requested medication (s) are on the active medication list: yes  Last refill:  today #60/0  Future visit scheduled: no  Notes to clinic: pharmacy requesting 90 day supply, pt due for appt.      Requested Prescriptions  Pending Prescriptions Disp Refills   metFORMIN (GLUCOPHAGE) 500 MG tablet [Pharmacy Med Name: METFORMIN HCL 500 MG TABLET] 60 tablet 0    Sig: TAKE 1 TABLET BY MOUTH TWICE A DAY     Endocrinology:  Diabetes - Biguanides Failed - 12/25/2021 12:16 PM      Failed - HBA1C is between 0 and 7.9 and within 180 days    HbA1c, POC (controlled diabetic range)  Date Value Ref Range Status  06/20/2021 5.6 0.0 - 7.0 % Final         Failed - B12 Level in normal range and within 720 days    No results found for: "VITAMINB12"       Failed - Valid encounter within last 6 months    Recent Outpatient Visits           6 months ago Type 2 diabetes mellitus without complication, with long-term current use of insulin ([place])   [place], Vernia Buff, NP   1 year ago Type 2 diabetes mellitus without complication, with long-term current use of insulin ([place])   [place], Vernia Buff, NP   1 year ago Type 2 diabetes mellitus with hyperglycemia, with long-term current use of insulin ([place])   [place], [place] W, NP   2 years ago Encounter to establish care   [place], [place] W, NP              Passed - Cr in normal range and within 360 days    Creatinine, Ser  Date Value Ref Range Status  06/20/2021 1.00 0.76 - 1.27 mg/dL Final         Passed - eGFR in normal range and within 360 days    GFR calc Af Amer  Date Value Ref Range Status  06/08/2019 >60 >60 mL/min Final   GFR, Estimated  Date Value Ref Range Status  03/18/2021 >60 >60 mL/min Final    Comment:     (NOTE) Calculated using the CKD-EPI Creatinine Equation (2021)    eGFR  Date Value Ref Range Status  06/20/2021 101 >59 mL/min/1.73 Final         Passed - CBC within normal limits and completed in the last 12 months    WBC  Date Value Ref Range Status  03/18/2021 17.2 (H) 4.0 - 10.5 K/uL Final   RBC  Date Value Ref Range Status  03/18/2021 4.33 4.22 - 5.81 MIL/uL Final   Hemoglobin  Date Value Ref Range Status  03/18/2021 13.2 13.0 - 17.0 g/dL Final   HCT  Date Value Ref Range Status  03/18/2021 40.3 39.0 - 52.0 % Final   MCHC  Date Value Ref Range Status  03/18/2021 32.8 30.0 - 36.0 g/dL Final   [facility]  Date Value Ref Range Status  03/18/2021 30.5 26.0 - 34.0 pg Final   MCV  Date Value Ref Range Status  03/18/2021 93.1 80.0 - 100.0 fL Final   No results found for: "PLTCOUNTKUC", "LABPLAT", "POCPLA" RDW  Date Value Ref Range Status  03/18/2021 11.7 11.5 - 15.5 % Final

## 2021-12-25 NOTE — Telephone Encounter (Signed)
Called pt - [id number] for appt.  

## 2021-12-26 NOTE — Telephone Encounter (Signed)
Refilled 12/25/21. Courtesy refill.

## 2022-01-26 ENCOUNTER — Encounter: Payer: Self-pay | Admitting: Nurse Practitioner

## 2022-01-27 ENCOUNTER — Other Ambulatory Visit: Payer: Self-pay | Admitting: Nurse Practitioner

## 2022-01-27 DIAGNOSIS — E119 Type 2 diabetes mellitus without complications: Secondary | ICD-10-CM

## 2022-01-27 DIAGNOSIS — E785 Hyperlipidemia, unspecified: Secondary | ICD-10-CM

## 2022-01-27 DIAGNOSIS — N529 Male erectile dysfunction, unspecified: Secondary | ICD-10-CM

## 2022-01-27 DIAGNOSIS — F418 Other specified anxiety disorders: Secondary | ICD-10-CM

## 2022-01-27 MED ORDER — VICTOZA 18 MG/3ML ~~LOC~~ SOPN: 1.8000 mg | PEN_INJECTOR | Freq: Every day | SUBCUTANEOUS | 0 refills | Status: DC

## 2022-01-27 MED ORDER — SILDENAFIL CITRATE 100 MG PO TABS: ORAL_TABLET | ORAL | 11 refills | Status: AC

## 2022-01-27 MED ORDER — FLUTICASONE PROPIONATE HFA 44 MCG/ACT IN AERO: 2.0000 | INHALATION_SPRAY | Freq: Every day | RESPIRATORY_TRACT | 1 refills | Status: AC | PRN

## 2022-01-27 MED ORDER — ATORVASTATIN CALCIUM 20 MG PO TABS: 20.0000 mg | ORAL_TABLET | Freq: Every day | ORAL | 3 refills | Status: DC

## 2022-01-27 MED ORDER — LISINOPRIL 5 MG PO TABS: 5.0000 mg | ORAL_TABLET | Freq: Every day | ORAL | 0 refills | Status: DC

## 2022-01-27 MED ORDER — NOVOLIN 70/30 FLEXPEN (70-30) 100 UNIT/ML ~~LOC~~ SUPN: 10.0000 [IU] | PEN_INJECTOR | Freq: Two times a day (BID) | SUBCUTANEOUS | 0 refills | Status: DC

## 2022-01-27 MED ORDER — CETIRIZINE HCL 10 MG PO TABS
10.0000 mg | ORAL_TABLET | Freq: Every day | ORAL | 0 refills | Status: DC | PRN
Start: 2022-01-27 — End: 2022-02-18

## 2022-01-27 MED ORDER — METFORMIN HCL 500 MG PO TABS: 500.0000 mg | ORAL_TABLET | Freq: Two times a day (BID) | ORAL | 0 refills | Status: DC

## 2022-01-27 MED ORDER — INSULIN PEN NEEDLE 31G X 5 MM MISC: 6 refills | Status: AC

## 2022-01-27 MED ORDER — SERTRALINE HCL 25 MG PO TABS: 25.0000 mg | ORAL_TABLET | Freq: Every day | ORAL | 3 refills | Status: DC

## 2022-02-18 ENCOUNTER — Other Ambulatory Visit: Payer: Self-pay | Admitting: Nurse Practitioner

## 2022-02-19 ENCOUNTER — Ambulatory Visit: Payer: Self-pay | Admitting: *Deleted

## 2022-02-19 ENCOUNTER — Telehealth: Payer: No Typology Code available for payment source | Admitting: Family

## 2022-02-19 ENCOUNTER — Ambulatory Visit: Payer: Self-pay

## 2022-02-19 DIAGNOSIS — J069 Acute upper respiratory infection, unspecified: Secondary | ICD-10-CM | POA: Diagnosis not present

## 2022-02-19 MED ORDER — FLUTICASONE PROPIONATE 50 MCG/ACT NA SUSP: 2.0000 | Freq: Every day | NASAL | 6 refills | Status: DC

## 2022-02-19 MED ORDER — BENZONATATE 100 MG PO CAPS: 100.0000 mg | ORAL_CAPSULE | Freq: Three times a day (TID) | ORAL | 0 refills | Status: DC | PRN

## 2022-02-19 NOTE — Telephone Encounter (Addendum)
Summary:  Per agent: "Cold symptoms, seeking appt   Pt called reporting that he has cold symptoms; congestion, sore throat, feeling weak for 3/4 days.  Best contact: [phone]  Pt is seeking appt, nothing available."     Chief Complaint: Generalized Weakness Symptoms: Fatigued, cough, mild sore throat Frequency: 3-4 days ago Pertinent Negatives: Patient denies fever Disposition: [] ED /[] Urgent Care (no appt availability in office) / [] Appointment(In office/virtual)/ [x]  [place] [facility]/ [] Home Care/ [] Refused Recommended Disposition /[] [place] [facility]/ []  Follow-up with PCP Additional Notes: No availability, secured [place] Virtual for pt. Care advise provided, verbalizes understanding.  (Pt triaged earlier, home care advise provided) Reason for Disposition  [1] MODERATE weakness (i.e., interferes with work, school, normal activities) AND [2] persists > 3 days  Answer Assessment - Initial Assessment Questions 1. DESCRIPTION: "Describe how you are feeling."     Weak 2. SEVERITY: "How bad is it?"  "Can you stand and walk?"   - MILD (0-3): Feels weak or tired, but does not interfere with work, school or normal activities.   - MODERATE (4-7): Able to stand and walk; weakness interferes with work, school, or normal activities.   - SEVERE (8-10): Unable to stand or walk; unable to do usual activities.     Moderate 3. ONSET: "When did these symptoms begin?" (e.g., hours, days, weeks, months)     3-4 days ago 4. CAUSE: "What do you think is causing the weakness or fatigue?" (e.g., not drinking enough fluids, medical problem, trouble sleeping)     unsure 5. NEW MEDICINES:  "Have you started on any new medicines recently?" (e.g., opioid pain medicines, benzodiazepines, muscle relaxants, antidepressants, antihistamines, neuroleptics, beta blockers)     No 6. OTHER SYMPTOMS: "Do you have any other symptoms?" (e.g., chest pain, fever, cough, SOB, vomiting, diarrhea,  bleeding, other areas of pain)     Sore throat, cough, yellow at times  Protocols used: Weakness (Generalized) and Fatigue-A-AH

## 2022-02-19 NOTE — Progress Notes (Signed)
Virtual Visit Consent   Jose Burgess, you are scheduled for a virtual visit with a [facility] provider today. Just as with appointments in the office, your consent must be obtained to participate. Your consent will be active for this visit and any virtual visit you may have with one of our providers in the next 365 days. If you have a MyChart account, a copy of this consent can be sent to you electronically.  As this is a virtual visit, video technology does not allow for your provider to perform a traditional examination. This may limit your provider's ability to fully assess your condition. If your provider identifies any concerns that need to be evaluated in person or the need to arrange testing (such as labs, EKG, etc.), we will make arrangements to do so. Although advances in technology are sophisticated, we cannot ensure that it will always work on either your end or our end. If the connection with a video visit is poor, the visit may have to be switched to a telephone visit. With either a video or telephone visit, we are not always able to ensure that we have a secure connection.  By engaging in this virtual visit, you consent to the provision of healthcare and authorize for your insurance to be billed (if applicable) for the services provided during this visit. Depending on your insurance coverage, you may receive a charge related to this service.  I need to obtain your verbal consent now. Are you willing to proceed with your visit today? Jose Lucy Luiz has provided verbal consent on 02/19/2022 for a virtual visit (video or telephone). Evelina Dun, FNP  Date: 02/19/2022 5:45 PM  Virtual Visit via Video Note   I, Evelina Dun, connected with  BRYLER DIBBLE  ([id number], 06-27-1985) on 02/19/22 at  5:45 PM EST by a video-enabled telemedicine application and verified that I am speaking with the correct person using two identifiers.  Location: Patient: Virtual Visit Location Patient:  Home Provider: Virtual Visit Location Provider: Home Office   I discussed the limitations of evaluation and management by telemedicine and the availability of in person appointments. The patient expressed understanding and agreed to proceed.    History of Present Illness: Jose Burgess is a 37 y.o. who identifies as a male who was assigned male at birth, and is being seen today for cough and congestion that started three day ago.  HPI: URI  This is a new problem. The current episode started in the past 7 days. The problem has been unchanged. Associated symptoms include congestion, coughing, ear pain, headaches, nausea, rhinorrhea, sinus pain, sneezing, a sore throat and vomiting. He has tried acetaminophen and decongestant for the symptoms. The treatment provided mild relief.    Problems:  Patient Active Problem List   Diagnosis Date Noted   Hypokalemia    Diabetes mellitus with nonketotic hyperosmolarity ([place]) 04/01/2020   Anxiety with depression 04/01/2020   Hypertension 04/01/2020   Class 3 severe obesity due to excess calories with body mass index (BMI) of 40.0 to 44.9 in adult [facility]) 11/25/2017   Vitiligo 11/25/2017    Allergies: No Known Allergies Medications:  Current Outpatient Medications:    benzonatate (TESSALON PERLES) 100 MG capsule, Take 1 capsule (100 mg total) by mouth 3 (three) times daily as needed., Disp: 20 capsule, Rfl: 0   fluticasone (FLONASE) 50 MCG/ACT nasal spray, Place 2 sprays into both nostrils daily., Disp: 16 g, Rfl: 6   atorvastatin (LIPITOR) 20 MG tablet,  Take 1 tablet (20 mg total) by mouth daily., Disp: 90 tablet, Rfl: 3   cetirizine (ZYRTEC) 10 MG tablet, TAKE 1 TABLET BY MOUTH DAILY AS NEEDED FOR ALLERGIES OR RHINITIS., Disp: 90 tablet, Rfl: 0   diclofenac (VOLTAREN) 75 MG EC tablet, Take 1 tablet (75 mg total) by mouth 2 (two) times daily., Disp: 60 tablet, Rfl: 1   fluticasone (FLOVENT HFA) 44 MCG/ACT inhaler, Inhale 2 puffs into the lungs daily as  needed (shortness of breath)., Disp: 1 each, Rfl: 1   insulin isophane & regular human KwikPen (NOVOLIN 70/30 KWIKPEN) (70-30) 100 UNIT/ML KwikPen, Inject 10 Units into the skin 2 (two) times daily., Disp: 10 mL, Rfl: 0   Insulin Pen Needle 31G X 5 MM MISC, Use as instructed. Inject into the skin once daily, Disp: 100 each, Rfl: 6   liraglutide (VICTOZA) 18 MG/3ML SOPN, Inject 1.8 mg into the skin daily. NEEDS OFFICE VISIT., Disp: 9 mL, Rfl: 0   lisinopril (ZESTRIL) 5 MG tablet, Take 1 tablet (5 mg total) by mouth daily., Disp: 30 tablet, Rfl: 0   metFORMIN (GLUCOPHAGE) 500 MG tablet, Take 1 tablet (500 mg total) by mouth 2 (two) times daily., Disp: 60 tablet, Rfl: 0   sertraline (ZOLOFT) 25 MG tablet, Take 1 tablet (25 mg total) by mouth daily., Disp: 90 tablet, Rfl: 3   sildenafil (VIAGRA) 100 MG tablet, TAKE 0.5-1 TABLETS (50-100 MG TOTAL) BY MOUTH DAILY AS NEEDED FOR ERECTILE DYSFUNCTION., Disp: 10 tablet, Rfl: 11  Observations/Objective: Patient is well-developed, well-nourished in no acute distress.  Resting comfortably  at home.  Head is normocephalic, atraumatic.  No labored breathing.  Speech is clear and coherent with logical content.  Patient is alert and oriented at baseline.  Nasal congestion   Assessment and Plan: 1. Viral URI - fluticasone (FLONASE) 50 MCG/ACT nasal spray; Place 2 sprays into both nostrils daily.  Dispense: 16 g; Refill: 6 - benzonatate (TESSALON PERLES) 100 MG capsule; Take 1 capsule (100 mg total) by mouth 3 (three) times daily as needed.  Dispense: 20 capsule; Refill: 0  - Take meds as prescribed - Use a cool mist humidifier  -Use saline nose sprays frequently -Force fluids -For any cough or congestion  Use plain Mucinex- regular strength or max strength is fine -For fever or aces or pains- take tylenol or ibuprofen. -Throat lozenges if help -Follow up if symptoms worsen or do not improve   Follow Up Instructions: I discussed the assessment and  treatment plan with the patient. The patient was provided an opportunity to ask questions and all were answered. The patient agreed with the plan and demonstrated an understanding of the instructions.  A copy of instructions were sent to the patient via MyChart unless otherwise noted below.     The patient was advised to call back or seek an in-person evaluation if the symptoms worsen or if the condition fails to improve as anticipated.  Time:  I spent 7 minutes with the patient via telehealth technology discussing the above problems/concerns.    Evelina Dun, FNP

## 2022-02-19 NOTE — Telephone Encounter (Signed)
  Chief Complaint: "I have a cold" Symptoms: Sore throat, chest and nasal congestion, cough with yellow mucus and vomiting.   Has not tried any OTC medications. Frequency: For last 3-4 days Pertinent Negatives: Patient denies Shortness of breath or chest pain.   Disposition: [] ED /[] Urgent Care (no appt availability in office) / [] Appointment(In office/virtual)/ []  [facility]/ [x] Home Care/ [] Refused Recommended Disposition /[] [facility]/ []  Follow-up with PCP Additional Notes: I went over the Home Care Advice with him.   Encouraged him to drink plenty of fluids and take Pepto Bismol for the vomiting.   If not getting better per the advice to call us back.   Pt. Was agreeable to this plan.

## 2022-02-19 NOTE — Telephone Encounter (Signed)
Reason for Disposition  Common cold with no complications  Answer Assessment - Initial Assessment Questions 1. ONSET: "When did the nasal discharge start?"      3-4 days ago the symptoms started.   Congestion, sore throat, vomiting, weakness.    2. AMOUNT: "How much discharge is there?"      Congestion in chest and nose.   I'm coughing up mucus that is yellow. 3. COUGH: "Do you have a cough?" If Yes, ask: "Describe the color of your sputum" (clear, white, yellow, green)     Yes   I'm hot and cold. 4. RESPIRATORY DISTRESS: "Describe your breathing."      My chest hurts when I cough.      5. FEVER: "Do you have a fever?" If Yes, ask: "What is your temperature, how was it measured, and when did it start?"     I'm not sure 6. SEVERITY: "Overall, how bad are you feeling right now?" (e.g., doesn't interfere with normal activities, staying home from school/work, staying in bed)      I feel weak. 7. OTHER SYMPTOMS: "Do you have any other symptoms?" (e.g., sore throat, earache, wheezing, vomiting)     Sore throat, congestion in nose and chest, vomiting. Went over some OTC medications he could use for the symptoms. 8. PREGNANCY: "Is there any chance you are pregnant?" "When was your last menstrual period?"     N/A  Protocols used: Common Cold-A-AH

## 2022-02-19 NOTE — Patient Instructions (Signed)

## 2022-03-02 ENCOUNTER — Other Ambulatory Visit: Payer: Self-pay | Admitting: Nurse Practitioner

## 2022-03-02 DIAGNOSIS — E119 Type 2 diabetes mellitus without complications: Secondary | ICD-10-CM

## 2022-03-04 ENCOUNTER — Encounter: Payer: Self-pay | Admitting: Nurse Practitioner

## 2022-03-04 MED ORDER — NOVOLIN 70/30 FLEXPEN (70-30) 100 UNIT/ML ~~LOC~~ SUPN: 10.0000 [IU] | PEN_INJECTOR | Freq: Two times a day (BID) | SUBCUTANEOUS | 0 refills | Status: DC

## 2022-03-04 MED ORDER — LISINOPRIL 5 MG PO TABS: 5.0000 mg | ORAL_TABLET | Freq: Every day | ORAL | 0 refills | Status: DC

## 2022-03-18 ENCOUNTER — Encounter: Payer: Self-pay | Admitting: Nurse Practitioner

## 2022-03-18 ENCOUNTER — Ambulatory Visit: Payer: No Typology Code available for payment source | Attending: Nurse Practitioner | Admitting: Nurse Practitioner

## 2022-03-18 VITALS — BP 136/81 | HR 85 | Ht 70.0 in | Wt 293.0 lb

## 2022-03-18 DIAGNOSIS — E1165 Type 2 diabetes mellitus with hyperglycemia: Secondary | ICD-10-CM

## 2022-03-18 DIAGNOSIS — Z23 Encounter for immunization: Secondary | ICD-10-CM

## 2022-03-18 DIAGNOSIS — I10 Essential (primary) hypertension: Secondary | ICD-10-CM | POA: Diagnosis not present

## 2022-03-18 DIAGNOSIS — D72829 Elevated white blood cell count, unspecified: Secondary | ICD-10-CM | POA: Diagnosis not present

## 2022-03-18 DIAGNOSIS — E119 Type 2 diabetes mellitus without complications: Secondary | ICD-10-CM

## 2022-03-18 DIAGNOSIS — J4520 Mild intermittent asthma, uncomplicated: Secondary | ICD-10-CM

## 2022-03-18 DIAGNOSIS — Z794 Long term (current) use of insulin: Secondary | ICD-10-CM

## 2022-03-18 DIAGNOSIS — F331 Major depressive disorder, recurrent, moderate: Secondary | ICD-10-CM

## 2022-03-18 DIAGNOSIS — Z6841 Body Mass Index (BMI) 40.0 and over, adult: Secondary | ICD-10-CM

## 2022-03-18 DIAGNOSIS — E66813 Obesity, class 3: Secondary | ICD-10-CM

## 2022-03-18 LAB — POCT GLYCOSYLATED HEMOGLOBIN (HGB A1C): HbA1c, POC (controlled diabetic range): 6 % (ref 0.0–7.0)

## 2022-03-18 LAB — GLUCOSE, POCT (MANUAL RESULT ENTRY): POC Glucose: 69 mg/dl — AB (ref 70–99)

## 2022-03-18 MED ORDER — LISINOPRIL 5 MG PO TABS: 5.0000 mg | ORAL_TABLET | Freq: Every day | ORAL | 3 refills | Status: DC

## 2022-03-18 MED ORDER — METFORMIN HCL 500 MG PO TABS: 500.0000 mg | ORAL_TABLET | Freq: Two times a day (BID) | ORAL | 1 refills | Status: DC

## 2022-03-18 MED ORDER — FLUTICASONE-SALMETEROL 100-50 MCG/ACT IN AEPB: 1.0000 | INHALATION_SPRAY | Freq: Two times a day (BID) | RESPIRATORY_TRACT | 3 refills | Status: AC

## 2022-03-18 MED ORDER — OZEMPIC (0.25 OR 0.5 MG/DOSE) 2 MG/3ML ~~LOC~~ SOPN: 0.2500 mg | PEN_INJECTOR | SUBCUTANEOUS | 1 refills | Status: DC

## 2022-03-18 MED ORDER — NOVOLIN 70/30 FLEXPEN (70-30) 100 UNIT/ML ~~LOC~~ SUPN: 10.0000 [IU] | PEN_INJECTOR | Freq: Two times a day (BID) | SUBCUTANEOUS | 2 refills | Status: DC

## 2022-03-18 NOTE — Progress Notes (Signed)
Assessment & Plan:  Jose Burgess was seen today for diabetes and hypertension.  Diagnoses and all orders for this visit:  Type 2 diabetes mellitus with hyperglycemia, with long-term current use of insulin (HCC) -     POCT glycosylated hemoglobin (Hb A1C) -     POCT glucose (manual entry) -     CMP14+EGFR -     Microalbumin / creatinine urine ratio Continue blood sugar control as discussed in office today, low carbohydrate diet, and regular physical exercise as tolerated, 150 minutes per week (30 min each day, 5 days per week, or 50 min 3 days per week). Keep blood sugar logs with fasting goal of 90-130 mg/dl, post prandial (after you eat) less than 180.  For Hypoglycemia: BS <60 and Hyperglycemia BS >400; contact the clinic ASAP. Annual eye exams and foot exams are recommended.   Primary hypertension -     CMP14+EGFR Continue all antihypertensives as prescribed.  Reminded to bring in blood pressure log for follow  up appointment.  RECOMMENDATIONS: DASH/Mediterranean Diets are healthier choices for HTN.    MDD (major depressive disorder), recurrent episode, moderate (HCC) Continue sertraline as prescribed. Reports improved mood lability  Class 3 severe obesity due to excess calories without serious comorbidity with body mass index (BMI) of 40.0 to 44.9 in adult [facility]) Discussed diet and exercise for person with BMI >40. Instructed: You must burn more calories than you eat. Losing 5 percent of your body weight should be considered a success. In the longer term, losing more than 15 percent of your body weight and staying at this weight is an extremely good result. However, keep in mind that even losing 5 percent of your body weight leads to important health benefits, so try not to get discouraged if you're not able to lose more than this. Will recheck weight in 3-6 months.   Leukocytosis, unspecified type -     CBC with Differential    Patient has been counseled on age-appropriate routine  health concerns for screening and prevention. These are reviewed and up-to-date. Referrals have been placed accordingly. Immunizations are up-to-date or declined.    Subjective:   Chief Complaint  Patient presents with   Diabetes   Hypertension   HPI Jose Burgess 37 y.o. male presents to office today for follow up to HTN and DM   He has a past medical history of Anxiety, Depression, Diabetes 1.5, managed as type 2, Vitiligo, Hypertension, and Morbid obesity    DM 2 Diabetes is well controlled. Notes average blood glucose readings: 100-130s.  Checking blood sugars mostly fasting. Administering 70/30 from  units BID to 10 units BID. His insurance would not cover victoza 1.8 mg daily/. Will try ozempic. He is taking metformin 500 mg BID. LDL not quite at goal with atorvastatin 20 mg daily Lab Results  Component Value Date   HGBA1C 6.0 03/18/2022    Lab Results  Component Value Date   HGBA1C 5.6 06/20/2021    Lab Results  Component Value Date   LDLCALC 96 07/31/2020      HTN Blood pressure is well controlled today with renal dose lisinopril. He does endorse medication adherence  BP Readings from Last 3 Encounters:  03/18/22 136/81  06/20/21 (!) 142/84  03/18/21 122/72    Anxiety and Depression  No concerns today. Taking zoloft daily.    Review of Systems  Constitutional:  Negative for fever, malaise/fatigue and weight loss.  HENT: Negative.  Negative for nosebleeds.   Eyes: Negative.  Negative for blurred vision, double vision and photophobia.  Respiratory: Negative.  Negative for cough and shortness of breath.   Cardiovascular: Negative.  Negative for chest pain, palpitations and leg swelling.  Gastrointestinal: Negative.  Negative for heartburn, nausea and vomiting.  Musculoskeletal: Negative.  Negative for myalgias.  Neurological: Negative.  Negative for dizziness, focal weakness, seizures and headaches.  Psychiatric/Behavioral: Negative.  Negative for suicidal  ideas.     Past Medical History:  Diagnosis Date   Anxiety    Depression    Diabetes 1.5, managed as type 2 ([place])    Hypertension    Obese     Past Surgical History:  Procedure Laterality Date   NO PAST SURGERIES      Family History  Problem Relation Age of Onset   Anxiety disorder Mother    Hyperlipidemia Mother    Diabetes Mother    Heart disease Father    Hyperlipidemia Father    Diabetes Father     Social History Reviewed with no changes to be made today.   Outpatient Medications Prior to Visit  Medication Sig Dispense Refill   atorvastatin (LIPITOR) 20 MG tablet Take 1 tablet (20 mg total) by mouth daily. 90 tablet 3   cetirizine (ZYRTEC) 10 MG tablet TAKE 1 TABLET BY MOUTH DAILY AS NEEDED FOR ALLERGIES OR RHINITIS. 90 tablet 0   diclofenac (VOLTAREN) 75 MG EC tablet Take 1 tablet (75 mg total) by mouth 2 (two) times daily. 60 tablet 1   Insulin Pen Needle 31G X 5 MM MISC Use as instructed. Inject into the skin once daily 100 each 6   sertraline (ZOLOFT) 25 MG tablet Take 1 tablet (25 mg total) by mouth daily. 90 tablet 3   insulin isophane & regular human KwikPen (NOVOLIN 70/30 KWIKPEN) (70-30) 100 UNIT/ML KwikPen Inject 10 Units into the skin 2 (two) times daily. 10 mL 0   lisinopril (ZESTRIL) 5 MG tablet Take 1 tablet (5 mg total) by mouth daily. 30 tablet 0   metFORMIN (GLUCOPHAGE) 500 MG tablet Take 1 tablet (500 mg total) by mouth 2 (two) times daily. 60 tablet 0   fluticasone (FLOVENT HFA) 44 MCG/ACT inhaler Inhale 2 puffs into the lungs daily as needed (shortness of breath). (Patient not taking: Reported on 03/18/2022) 1 each 1   sildenafil (VIAGRA) 100 MG tablet TAKE 0.5-1 TABLETS (50-100 MG TOTAL) BY MOUTH DAILY AS NEEDED FOR ERECTILE DYSFUNCTION. (Patient not taking: Reported on 03/18/2022) 10 tablet 11   benzonatate (TESSALON PERLES) 100 MG capsule Take 1 capsule (100 mg total) by mouth 3 (three) times daily as needed. (Patient not taking: Reported on  03/18/2022) 20 capsule 0   fluticasone (FLONASE) 50 MCG/ACT nasal spray Place 2 sprays into both nostrils daily. (Patient not taking: Reported on 03/18/2022) 16 g 6   liraglutide (VICTOZA) 18 MG/3ML SOPN Inject 1.8 mg into the skin daily. NEEDS OFFICE VISIT. (Patient not taking: Reported on 03/18/2022) 9 mL 0   No facility-administered medications prior to visit.    No Known Allergies     Objective:    BP 136/81   Pulse 85   Ht 5' 10"$  (1.778 m)   Wt 293 lb (132.9 kg)   SpO2 98%   BMI 42.04 kg/m  Wt Readings from Last 3 Encounters:  03/18/22 293 lb (132.9 kg)  06/20/21 289 lb 6.4 oz (131.3 kg)  03/18/21 300 lb (136.1 kg)    Physical Exam Vitals and nursing note reviewed.  Constitutional:  Appearance: He is well-developed.  HENT:     Head: Normocephalic and atraumatic.  Cardiovascular:     Rate and Rhythm: Normal rate and regular rhythm.     Heart sounds: Normal heart sounds. No murmur heard.    No friction rub. No gallop.  Pulmonary:     Effort: Pulmonary effort is normal. No tachypnea or respiratory distress.     Breath sounds: Normal breath sounds. No decreased breath sounds, wheezing, rhonchi or rales.  Chest:     Chest wall: No tenderness.  Abdominal:     General: Bowel sounds are normal.     Palpations: Abdomen is soft.  Musculoskeletal:        General: Normal range of motion.     Cervical back: Normal range of motion.  Skin:    General: Skin is warm and dry.     Comments: vitiligo  Neurological:     Mental Status: He is alert and oriented to person, place, and time.     Coordination: Coordination normal.  Psychiatric:        Behavior: Behavior normal. Behavior is cooperative.        Thought Content: Thought content normal.        Judgment: Judgment normal.          Patient has been counseled extensively about nutrition and exercise as well as the importance of adherence with medications and regular follow-up. The patient was given clear instructions  to go to ER or return to medical center if symptoms don't improve, worsen or new problems develop. The patient verbalized understanding.   Follow-up: Return in about 3 months (around 06/16/2022) for [place], FNP-BC [facility] and [facility] [place], [place]   03/18/2022, 2:49 PM

## 2022-03-19 ENCOUNTER — Telehealth: Payer: Self-pay

## 2022-03-19 LAB — CBC WITH DIFFERENTIAL/PLATELET
Basophils Absolute: 0.1 10*3/uL (ref 0.0–0.2)
Basos: 0 %
EOS (ABSOLUTE): 0.1 10*3/uL (ref 0.0–0.4)
Eos: 1 %
Hematocrit: 39.4 % (ref 37.5–51.0)
Hemoglobin: 13.2 g/dL (ref 13.0–17.7)
Immature Grans (Abs): 0 10*3/uL (ref 0.0–0.1)
Immature Granulocytes: 0 %
Lymphocytes Absolute: 2.5 10*3/uL (ref 0.7–3.1)
Lymphs: 22 %
MCH: 30.8 pg (ref 26.6–33.0)
MCHC: 33.5 g/dL (ref 31.5–35.7)
MCV: 92 fL (ref 79–97)
Monocytes Absolute: 1.2 10*3/uL — ABNORMAL HIGH (ref 0.1–0.9)
Monocytes: 10 %
Neutrophils Absolute: 7.6 10*3/uL — ABNORMAL HIGH (ref 1.4–7.0)
Neutrophils: 67 %
Platelets: 306 10*3/uL (ref 150–450)
RBC: 4.29 x10E6/uL (ref 4.14–5.80)
RDW: 11.7 % (ref 11.6–15.4)
WBC: 11.6 10*3/uL — ABNORMAL HIGH (ref 3.4–10.8)

## 2022-03-19 LAB — CMP14+EGFR
ALT: 28 IU/L (ref 0–44)
AST: 29 IU/L (ref 0–40)
Albumin/Globulin Ratio: 2.1 (ref 1.2–2.2)
Albumin: 4.9 g/dL (ref 4.1–5.1)
Alkaline Phosphatase: 77 IU/L (ref 44–121)
BUN/Creatinine Ratio: 14 (ref 9–20)
BUN: 13 mg/dL (ref 6–20)
Bilirubin Total: 0.7 mg/dL (ref 0.0–1.2)
CO2: 23 mmol/L (ref 20–29)
Calcium: 9.9 mg/dL (ref 8.7–10.2)
Chloride: 102 mmol/L (ref 96–106)
Creatinine, Ser: 0.9 mg/dL (ref 0.76–1.27)
Globulin, Total: 2.3 g/dL (ref 1.5–4.5)
Glucose: 83 mg/dL (ref 70–99)
Potassium: 3.8 mmol/L (ref 3.5–5.2)
Sodium: 142 mmol/L (ref 134–144)
Total Protein: 7.2 g/dL (ref 6.0–8.5)
eGFR: 114 mL/min/{1.73_m2} (ref >59)

## 2022-03-19 NOTE — Telephone Encounter (Signed)
Ozempic prior auth submitted to insurance today via covermymeds Key: [id number]

## 2022-03-23 ENCOUNTER — Other Ambulatory Visit: Payer: Self-pay

## 2022-03-23 NOTE — Telephone Encounter (Signed)
Pls let patient know ozempic was denied by insurance

## 2022-03-23 NOTE — Telephone Encounter (Signed)
Prior auth denied. To qualify for coverage pt must meet the following:

## 2022-03-24 NOTE — Telephone Encounter (Signed)
Patient identified by name and date of birth.  Patient aware of response and voiced understanding.

## 2022-04-03 ENCOUNTER — Other Ambulatory Visit: Payer: Self-pay | Admitting: Nurse Practitioner

## 2022-04-03 DIAGNOSIS — E119 Type 2 diabetes mellitus without complications: Secondary | ICD-10-CM

## 2022-04-03 NOTE — Telephone Encounter (Signed)
Change of pharmacy Requested Prescriptions  Pending Prescriptions Disp Refills   lisinopril (ZESTRIL) 5 MG tablet [Pharmacy Med Name: LISINOPRIL 5 MG TABLET] 90 tablet 2    Sig: TAKE 1 TABLET (5 MG TOTAL) BY MOUTH DAILY. NEEDS APPT FOR REFILL AND INSUR ONLY PAYS FOR 90 DAYS     Cardiovascular:  ACE Inhibitors Passed - 04/03/2022 12:13 AM      Passed - Cr in normal range and within 180 days    Creatinine, Ser  Date Value Ref Range Status  03/18/2022 0.90 0.76 - 1.27 mg/dL Final         Passed - K in normal range and within 180 days    Potassium  Date Value Ref Range Status  03/18/2022 3.8 3.5 - 5.2 mmol/L Final         Passed - Patient is not pregnant      Passed - Last BP in normal range    BP Readings from Last 1 Encounters:  03/18/22 136/81         Passed - Valid encounter within last 6 months    Recent Outpatient Visits           2 weeks ago Type 2 diabetes mellitus with hyperglycemia, with long-term current use of insulin [facility])   [place], [place] W, NP   9 months ago Type 2 diabetes mellitus without complication, with long-term current use of insulin [facility])   [place], [place] W, NP   1 year ago Type 2 diabetes mellitus without complication, with long-term current use of insulin [facility])   [place], [place] W, NP   1 year ago Type 2 diabetes mellitus with hyperglycemia, with long-term current use of insulin [facility])   [place], [place] Buff, NP   2 years ago Encounter to establish care   [place] Gildardo Pounds, NP       Future Appointments             In 2 months Gildardo Pounds, NP [place]

## 2022-06-05 LAB — HM DIABETES EYE EXAM

## 2022-06-12 ENCOUNTER — Telehealth: Payer: Self-pay | Admitting: Nurse Practitioner

## 2022-06-12 NOTE — Telephone Encounter (Signed)
Copied from CRM [id number]. Topic: General - Other >> Jun 12, 2022  4:15 PM Everette C wrote: Reason for CRM: The patient would like to speak with a member of staff about results of a previously submitted glaucoma test  Please contact further when possible

## 2022-06-15 NOTE — Telephone Encounter (Signed)
Patient identified by name and date of birth.   Patient aware of results and voiced understanding.   

## 2022-06-16 ENCOUNTER — Ambulatory Visit: Payer: No Typology Code available for payment source | Admitting: Nurse Practitioner

## 2022-06-22 IMAGING — DX DG CHEST 1V PORT
2 series · 2 of 2 positions shown · non-contrast
Comparison: 06/08/2019

CLINICAL DATA: Hyper glycemia

EXAM:
PORTABLE CHEST 1 VIEW

[chest ap (1 of 2)]
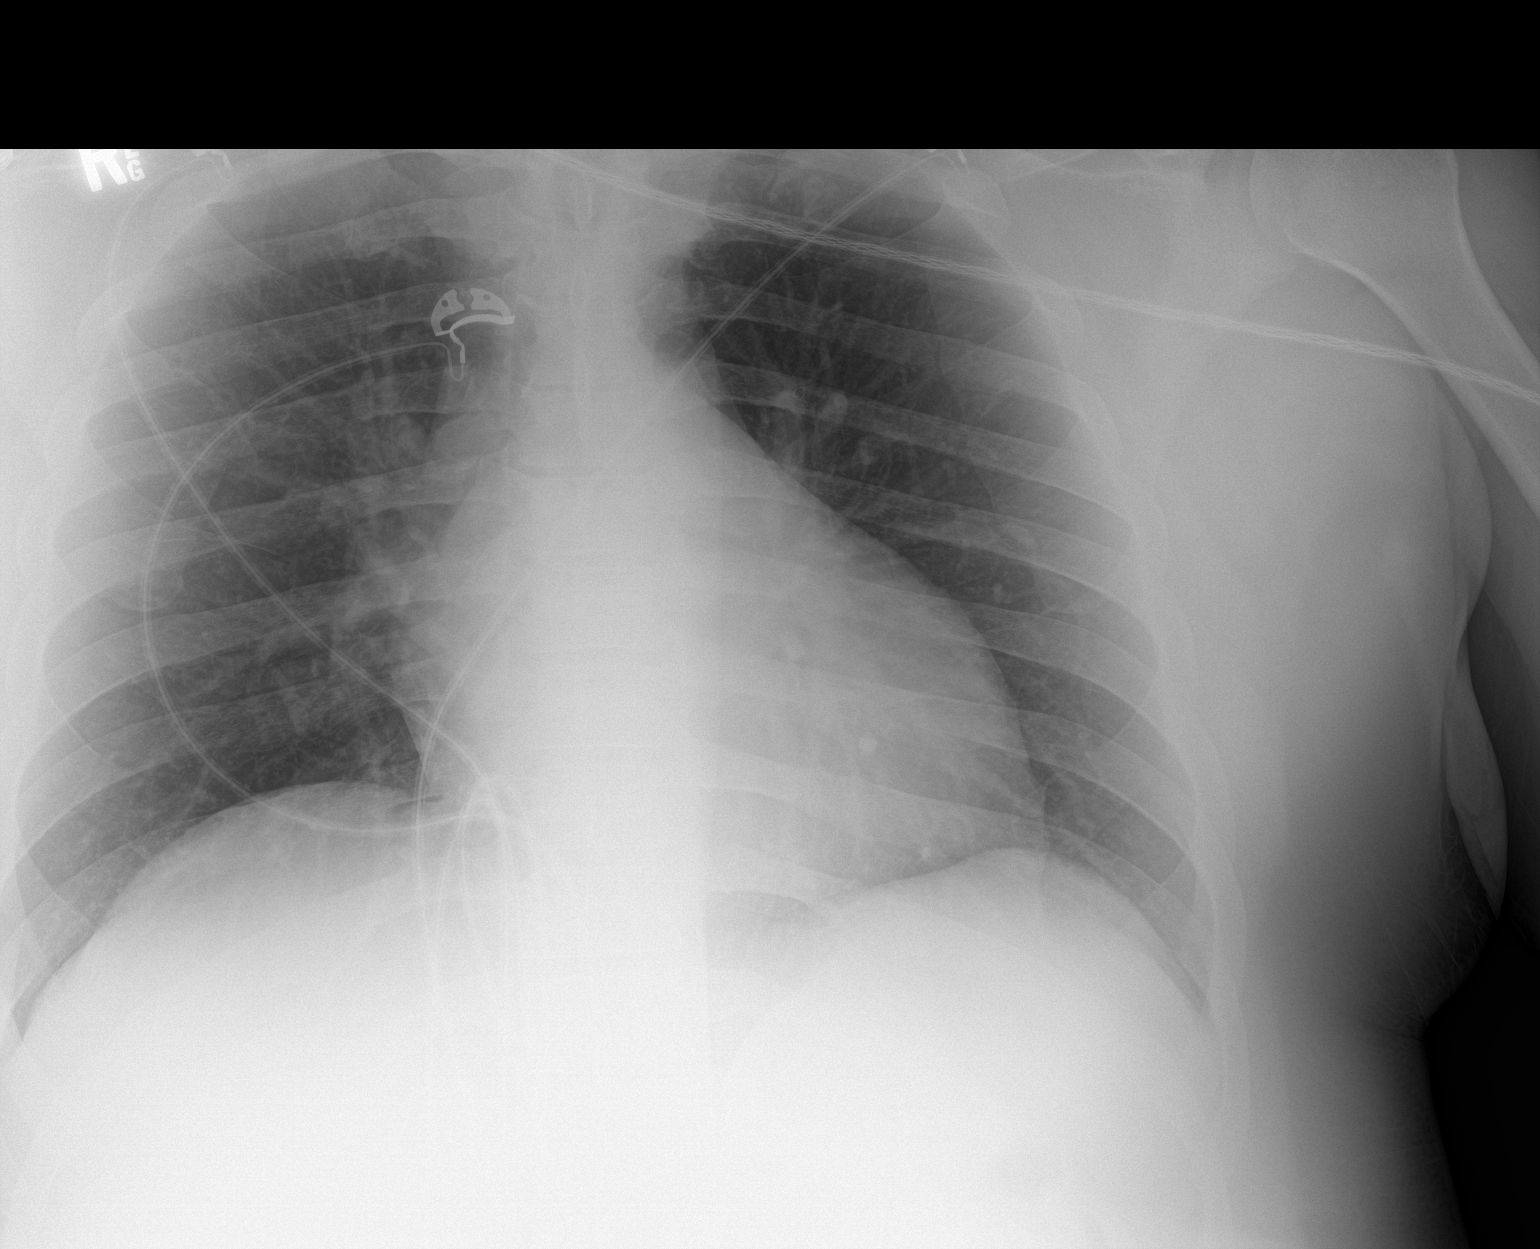

[chest ap (2 of 2)]
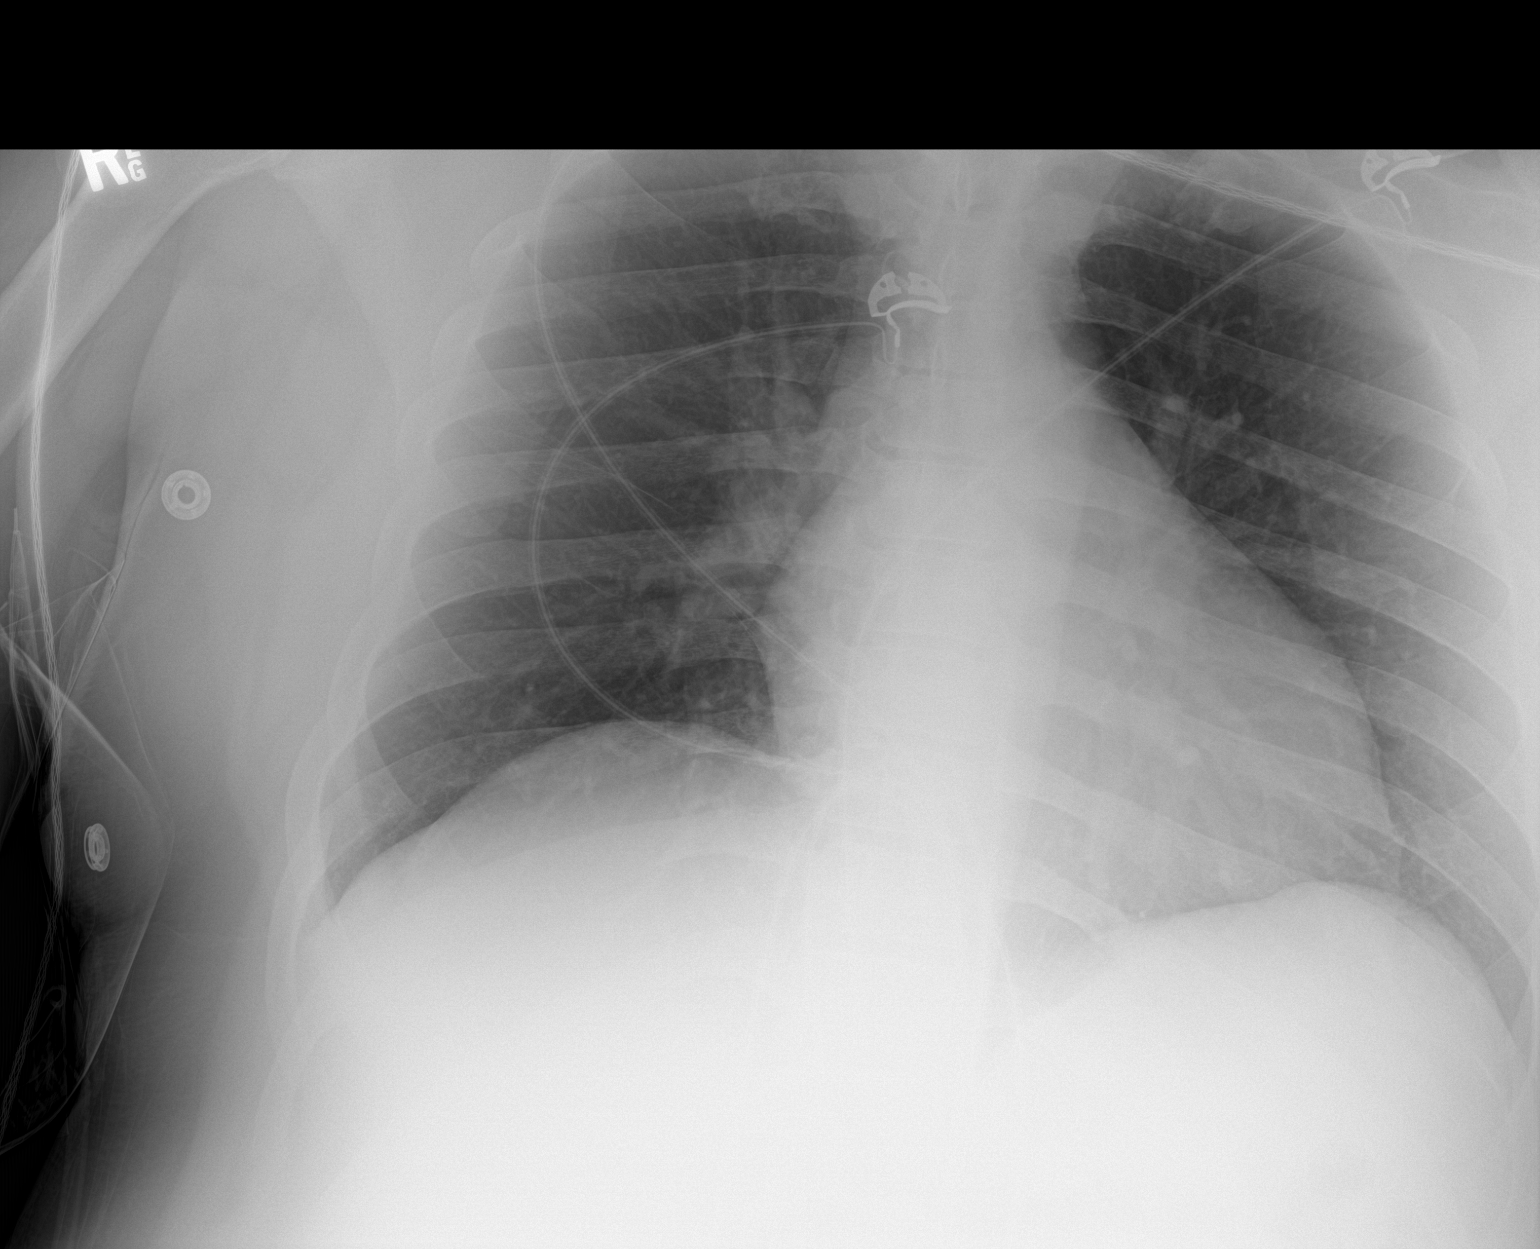

[2 of 2 positions shown; findings below may reference images not displayed]

FINDINGS: Heart size upper normal. Vascularity normal. Lungs clear without
infiltrate or effusion. No skeletal abnormality.
IMPRESSION: No active disease.

## 2022-08-13 ENCOUNTER — Other Ambulatory Visit: Payer: Self-pay | Admitting: Nurse Practitioner

## 2022-08-13 DIAGNOSIS — Z794 Long term (current) use of insulin: Secondary | ICD-10-CM

## 2022-08-14 ENCOUNTER — Encounter: Payer: Self-pay | Admitting: Nurse Practitioner

## 2022-08-14 ENCOUNTER — Other Ambulatory Visit: Payer: Self-pay | Admitting: Nurse Practitioner

## 2022-08-14 DIAGNOSIS — Z794 Long term (current) use of insulin: Secondary | ICD-10-CM

## 2022-08-14 MED ORDER — NOVOLIN 70/30 FLEXPEN (70-30) 100 UNIT/ML ~~LOC~~ SUPN: 10.0000 [IU] | PEN_INJECTOR | Freq: Two times a day (BID) | SUBCUTANEOUS | 2 refills | Status: DC

## 2022-08-14 NOTE — Telephone Encounter (Signed)
Medication Refill - Medication: NOVOLIN 70/30 KWIKPEN (70-30) 100 UNIT/ML KwikPen   Pt asked if it was possible to get a courtesy refill to get him through until his appointment 10/06/2022. Please advise.   Has the patient contacted their pharmacy? Yes.    (Agent: If yes, when and what did the pharmacy advise?)  Preferred Pharmacy (with phone number or street name):  [id number] - [place], [place] - [address] AT [address]  [address] [place] [address]  Phone: [phone] Fax: [phone]  Hours: Open 24 hours   Has the patient been seen for an appointment in the last year OR does the patient have an upcoming appointment? Yes.    Agent: Please be advised that RX refills may take up to 3 business days. We ask that you follow-up with your pharmacy.

## 2022-08-14 NOTE — Telephone Encounter (Signed)
Patient request courtesy refill until future OV, 10/06/22.

## 2022-08-14 NOTE — Telephone Encounter (Signed)
Requested medication (s) are due for refill today: yes  Requested medication (s) are on the active medication list: yes  Last refill:    Future visit scheduled: yes  Notes to clinic:  Patient request courtesy refill for insulin pen     Requested Prescriptions

## 2022-08-14 NOTE — Telephone Encounter (Signed)
[facility]

## 2022-08-24 ENCOUNTER — Encounter: Payer: Self-pay | Admitting: Nurse Practitioner

## 2022-08-24 ENCOUNTER — Ambulatory Visit: Payer: No Typology Code available for payment source | Attending: Nurse Practitioner | Admitting: Nurse Practitioner

## 2022-08-24 ENCOUNTER — Other Ambulatory Visit: Payer: Self-pay

## 2022-08-24 VITALS — BP 130/82 | HR 106 | Ht 70.0 in | Wt 295.8 lb

## 2022-08-24 DIAGNOSIS — D72829 Elevated white blood cell count, unspecified: Secondary | ICD-10-CM

## 2022-08-24 DIAGNOSIS — E1165 Type 2 diabetes mellitus with hyperglycemia: Secondary | ICD-10-CM

## 2022-08-24 DIAGNOSIS — Z794 Long term (current) use of insulin: Secondary | ICD-10-CM

## 2022-08-24 DIAGNOSIS — F418 Other specified anxiety disorders: Secondary | ICD-10-CM | POA: Diagnosis not present

## 2022-08-24 LAB — POCT GLYCOSYLATED HEMOGLOBIN (HGB A1C): Hemoglobin A1C: 11 % — AB (ref 4.0–5.6)

## 2022-08-24 MED ORDER — METFORMIN HCL ER 750 MG PO TB24
750.0000 mg | ORAL_TABLET | Freq: Two times a day (BID) | ORAL | 1 refills | Status: DC
Start: 2022-08-24 — End: 2023-07-21

## 2022-08-24 MED ORDER — OZEMPIC (0.25 OR 0.5 MG/DOSE) 2 MG/3ML ~~LOC~~ SOPN
0.2500 mg | PEN_INJECTOR | SUBCUTANEOUS | 1 refills | Status: DC
Start: 2022-08-24 — End: 2022-08-24
  Filled 2022-08-24: qty 3, 30d supply, fill #0

## 2022-08-24 MED ORDER — NOVOLIN 70/30 FLEXPEN (70-30) 100 UNIT/ML ~~LOC~~ SUPN: 20.0000 [IU] | PEN_INJECTOR | Freq: Two times a day (BID) | SUBCUTANEOUS | 2 refills | Status: DC

## 2022-08-24 MED ORDER — OZEMPIC (0.25 OR 0.5 MG/DOSE) 2 MG/3ML ~~LOC~~ SOPN
0.5000 mg | PEN_INJECTOR | SUBCUTANEOUS | 1 refills | Status: DC
Start: 2022-08-24 — End: 2023-05-19
  Filled 2022-08-24: qty 3, 28d supply, fill #0
  Filled 2023-04-21: qty 3, 28d supply, fill #1

## 2022-08-24 MED ORDER — SERTRALINE HCL 25 MG PO TABS
25.0000 mg | ORAL_TABLET | Freq: Every day | ORAL | 3 refills | Status: AC
Start: 2022-08-24 — End: ?

## 2022-08-24 NOTE — Progress Notes (Signed)
Assessment & Plan:  Jose Burgess was seen today for hyperglycemia and urinary frequency.  Diagnoses and all orders for this visit:  Type 2 diabetes mellitus with hyperglycemia, with long-term current use of insulin (HCC) Increase insulin to 20 units twice a day from 10 units twice daily Increase Ozempic from 0.25 mg weekly to 0.5 g weekly -     POCT glycosylated hemoglobin (Hb A1C) -     CMP14+EGFR -     metFORMIN (GLUCOPHAGE-XR) 750 MG 24 hr tablet; Take 1 tablet (750 mg total) by mouth in the morning and at bedtime. -     Semaglutide,0.25 or 0.5MG /DOS, (OZEMPIC, 0.25 OR 0.5 MG/DOSE,) 2 MG/3ML SOPN; Inject 0.5 mg into the skin once a week.  Leukocytosis, unspecified type -     CBC with Differential  Anxiety with depression -     sertraline (ZOLOFT) 25 MG tablet; Take 1 tablet (25 mg total) by mouth daily.    Patient has been counseled on age-appropriate routine health concerns for screening and prevention. These are reviewed and up-to-date. Referrals have been placed accordingly. Immunizations are up-to-date or declined.    Subjective:   Chief Complaint  Patient presents with   Hyperglycemia   Urinary Frequency   HPI Jose Burgess 37 y.o. male presents to office today for follow up to DM   He notes increased urinary frequency and hyperglycemia. A1c today has increased from 6 in February to 11.0 today. He endorses dietary nonadherence and lack of exercise. Eating more sweets. He is not administering his insulin.  Has been giving himself between 40 units of insulin BID.  Lab Results  Component Value Date   HGBA1C 11.0 (A) 08/24/2022    Lab Results  Component Value Date   HGBA1C 6.0 03/18/2022     Review of Systems  Constitutional:  Negative for fever, malaise/fatigue and weight loss.  HENT: Negative.  Negative for nosebleeds.   Eyes: Negative.  Negative for blurred vision, double vision and photophobia.  Respiratory: Negative.  Negative for cough and shortness of breath.    Cardiovascular: Negative.  Negative for chest pain, palpitations and leg swelling.  Gastrointestinal: Negative.  Negative for heartburn, nausea and vomiting.  Musculoskeletal: Negative.  Negative for myalgias.  Neurological: Negative.  Negative for dizziness, focal weakness, seizures and headaches.  Psychiatric/Behavioral:  Positive for depression. Negative for suicidal ideas. The patient is nervous/anxious.     Past Medical History:  Diagnosis Date   Anxiety    Depression    Diabetes 1.5, managed as type 2 (HCC)    Hypertension    Obese     Past Surgical History:  Procedure Laterality Date   NO PAST SURGERIES      Family History  Problem Relation Age of Onset   Anxiety disorder Mother    Hyperlipidemia Mother    Diabetes Mother    Heart disease Father    Hyperlipidemia Father    Diabetes Father     Social History Reviewed with no changes to be made today.   Outpatient Medications Prior to Visit  Medication Sig Dispense Refill   cetirizine (ZYRTEC) 10 MG tablet TAKE 1 TABLET BY MOUTH DAILY AS NEEDED FOR ALLERGIES OR RHINITIS. 90 tablet 0   fluticasone (FLOVENT HFA) 44 MCG/ACT inhaler Inhale 2 puffs into the lungs daily as needed (shortness of breath). 1 each 1   fluticasone-salmeterol (ADVAIR) 100-50 MCG/ACT AEPB Inhale 1 puff into the lungs 2 (two) times daily. 1 each 3   Insulin Pen Needle  31G X 5 MM MISC Use as instructed. Inject into the skin once daily 100 each 6   lisinopril (ZESTRIL) 5 MG tablet TAKE 1 TABLET (5 MG TOTAL) BY MOUTH DAILY. NEEDS APPT FOR REFILL AND INSUR ONLY PAYS FOR 90 DAYS 90 tablet 2   insulin isophane & regular human KwikPen (NOVOLIN 70/30 KWIKPEN) (70-30) 100 UNIT/ML KwikPen Inject 10 Units into the skin 2 (two) times daily. 10 mL 2   metFORMIN (GLUCOPHAGE) 500 MG tablet Take 1 tablet (500 mg total) by mouth 2 (two) times daily. 180 tablet 1   atorvastatin (LIPITOR) 20 MG tablet Take 1 tablet (20 mg total) by mouth daily. (Patient not taking:  Reported on 08/24/2022) 90 tablet 3   diclofenac (VOLTAREN) 75 MG EC tablet Take 1 tablet (75 mg total) by mouth 2 (two) times daily. (Patient not taking: Reported on 08/24/2022) 60 tablet 1   sildenafil (VIAGRA) 100 MG tablet TAKE 0.5-1 TABLETS (50-100 MG TOTAL) BY MOUTH DAILY AS NEEDED FOR ERECTILE DYSFUNCTION. (Patient not taking: Reported on 03/18/2022) 10 tablet 11   Semaglutide,0.25 or 0.5MG /DOS, (OZEMPIC, 0.25 OR 0.5 MG/DOSE,) 2 MG/3ML SOPN Inject 0.25 mg into the skin once a week. (Patient not taking: Reported on 08/24/2022) 3 mL 1   sertraline (ZOLOFT) 25 MG tablet Take 1 tablet (25 mg total) by mouth daily. (Patient not taking: Reported on 08/24/2022) 90 tablet 3   No facility-administered medications prior to visit.    No Known Allergies     Objective:    BP 130/82 (BP Location: Left Arm, Patient Position: Sitting, Cuff Size: Large)   Pulse (!) 106   Ht 5\' 10"  (1.778 m)   Wt 295 lb 12.8 oz (134.2 kg)   SpO2 96%   BMI 42.44 kg/m  Wt Readings from Last 3 Encounters:  08/24/22 295 lb 12.8 oz (134.2 kg)  03/18/22 293 lb (132.9 kg)  06/20/21 289 lb 6.4 oz (131.3 kg)    Physical Exam Vitals and nursing note reviewed.  Constitutional:      Appearance: He is well-developed.  HENT:     Head: Normocephalic and atraumatic.  Cardiovascular:     Rate and Rhythm: Normal rate and regular rhythm.     Heart sounds: Normal heart sounds. No murmur heard.    No friction rub. No gallop.  Pulmonary:     Effort: Pulmonary effort is normal. No tachypnea or respiratory distress.     Breath sounds: Normal breath sounds. No decreased breath sounds, wheezing, rhonchi or rales.  Chest:     Chest wall: No tenderness.  Abdominal:     General: Bowel sounds are normal.     Palpations: Abdomen is soft.  Musculoskeletal:        General: Normal range of motion.     Cervical back: Normal range of motion.  Skin:    General: Skin is warm and dry.  Neurological:     Mental Status: He is alert and  oriented to person, place, and time.     Coordination: Coordination normal.  Psychiatric:        Behavior: Behavior normal. Behavior is cooperative.        Thought Content: Thought content normal.        Judgment: Judgment normal.          Patient has been counseled extensively about nutrition and exercise as well as the importance of adherence with medications and regular follow-up. The patient was given clear instructions to go to ER or return to medical  center if symptoms don't improve, worsen or new problems develop. The patient verbalized understanding.   Follow-up: Return in about 4 weeks (around 09/21/2022) for Meter check with me or LUKE in 4 weeks. Claiborne Rigg, FNP-BC [facility] and [facility] [place], [place] [phone]   08/24/2022, 5:01 PM

## 2022-08-25 ENCOUNTER — Other Ambulatory Visit: Payer: Self-pay | Admitting: Nurse Practitioner

## 2022-09-01 ENCOUNTER — Encounter: Payer: Self-pay | Admitting: Nurse Practitioner

## 2022-09-26 ENCOUNTER — Encounter: Payer: Self-pay | Admitting: Nurse Practitioner

## 2022-10-04 ENCOUNTER — Other Ambulatory Visit: Payer: Self-pay | Admitting: Family Medicine

## 2022-10-04 DIAGNOSIS — E119 Type 2 diabetes mellitus without complications: Secondary | ICD-10-CM

## 2022-10-06 ENCOUNTER — Ambulatory Visit: Payer: No Typology Code available for payment source | Admitting: Nurse Practitioner

## 2022-11-23 ENCOUNTER — Other Ambulatory Visit: Payer: Self-pay | Admitting: Nurse Practitioner

## 2022-11-23 DIAGNOSIS — Z794 Long term (current) use of insulin: Secondary | ICD-10-CM

## 2023-01-01 ENCOUNTER — Encounter: Payer: Self-pay | Admitting: Nurse Practitioner

## 2023-03-16 ENCOUNTER — Telehealth: Payer: Self-pay | Admitting: Physician Assistant

## 2023-03-16 DIAGNOSIS — J208 Acute bronchitis due to other specified organisms: Secondary | ICD-10-CM

## 2023-03-16 MED ORDER — BENZONATATE 100 MG PO CAPS
100.0000 mg | ORAL_CAPSULE | Freq: Three times a day (TID) | ORAL | 0 refills | Status: DC | PRN
Start: 2023-03-16 — End: 2023-07-21

## 2023-03-16 MED ORDER — ALBUTEROL SULFATE HFA 108 (90 BASE) MCG/ACT IN AERS
1.0000 | INHALATION_SPRAY | Freq: Four times a day (QID) | RESPIRATORY_TRACT | 0 refills | Status: AC | PRN
Start: 2023-03-16 — End: ?

## 2023-03-16 NOTE — Progress Notes (Signed)
No response from patient by end of shift. Closing encounter until patient replies. Marked as a no-charge.

## 2023-03-16 NOTE — Progress Notes (Signed)
 Message sent to patient requesting further input regarding current symptoms. Awaiting patient response.

## 2023-03-16 NOTE — Addendum Note (Signed)
Addended by: Margaretann Loveless on: 03/16/2023 07:40 PM   Modules accepted: Orders, Level of Service

## 2023-03-16 NOTE — Progress Notes (Signed)
 E-Visit for Cough   We are sorry that you are not feeling well.  Here is how we plan to help!  Based on your presentation I believe you most likely have A cough due to a virus.  This is called viral bronchitis and is best treated by rest, plenty of fluids and control of the cough.  You may use Ibuprofen or Tylenol as directed to help your symptoms.     In addition you may use A non-prescription cough medication called Mucinex DM: take 2 tablets every 12 hours. and A prescription cough medication called Tessalon Perles 100mg . You may take 1-2 capsules every 8 hours as needed for your cough.  I have also prescribed Albuterol inhaler Use 1-2 puffs every 6 hours as needed for shortness of breath, chest tightness, and/or wheezing.  From your responses in the eVisit questionnaire you describe inflammation in the upper respiratory tract which is causing a significant cough.  This is commonly called Bronchitis and has four common causes:   Allergies Viral Infections Acid Reflux Bacterial Infection Allergies, viruses and acid reflux are treated by controlling symptoms or eliminating the cause. An example might be a cough caused by taking certain blood pressure medications. You stop the cough by changing the medication. Another example might be a cough caused by acid reflux. Controlling the reflux helps control the cough.  USE OF BRONCHODILATOR ("RESCUE") INHALERS: There is a risk from using your bronchodilator too frequently.  The risk is that over-reliance on a medication which only relaxes the muscles surrounding the breathing tubes can reduce the effectiveness of medications prescribed to reduce swelling and congestion of the tubes themselves.  Although you feel brief relief from the bronchodilator inhaler, your asthma may actually be worsening with the tubes becoming more swollen and filled with mucus.  This can delay other crucial treatments, such as oral steroid medications. If you need to use a  bronchodilator inhaler daily, several times per day, you should discuss this with your provider.  There are probably better treatments that could be used to keep your asthma under control.     HOME CARE Only take medications as instructed by your medical team. Complete the entire course of an antibiotic. Drink plenty of fluids and get plenty of rest. Avoid close contacts especially the very young and the elderly Cover your mouth if you cough or cough into your sleeve. Always remember to wash your hands A steam or ultrasonic humidifier can help congestion.   GET HELP RIGHT AWAY IF: You develop worsening fever. You become short of breath You cough up blood. Your symptoms persist after you have completed your treatment plan MAKE SURE YOU  Understand these instructions. Will watch your condition. Will get help right away if you are not doing well or get worse.    Thank you for choosing an e-visit.  Your e-visit answers were reviewed by a board certified advanced clinical practitioner to complete your personal care plan. Depending upon the condition, your plan could have included both over the counter or prescription medications.  Please review your pharmacy choice. Make sure the pharmacy is open so you can pick up prescription now. If there is a problem, you may contact your provider through [employer] and have the prescription routed to another pharmacy.  Your safety is important to [place]. If you have drug allergies check your prescription carefully.   For the next 24 hours you can use MyChart to ask questions about today's visit, request a non-urgent call  back, or ask for a work or school excuse. You will get an email in the next two days asking about your experience. I hope that your e-visit has been valuable and will speed your recovery.   I have spent 5 minutes in review of e-visit questionnaire, review and updating patient chart, medical decision making and response to patient.    Margaretann Loveless, PA-C

## 2023-04-01 ENCOUNTER — Other Ambulatory Visit (HOSPITAL_BASED_OUTPATIENT_CLINIC_OR_DEPARTMENT_OTHER): Payer: Self-pay | Admitting: Pharmacist

## 2023-04-01 ENCOUNTER — Encounter: Payer: Self-pay | Admitting: Pharmacist

## 2023-04-01 DIAGNOSIS — Z7984 Long term (current) use of oral hypoglycemic drugs: Secondary | ICD-10-CM

## 2023-04-01 DIAGNOSIS — Z794 Long term (current) use of insulin: Secondary | ICD-10-CM

## 2023-04-01 DIAGNOSIS — E1165 Type 2 diabetes mellitus with hyperglycemia: Secondary | ICD-10-CM

## 2023-04-01 NOTE — Progress Notes (Signed)
 Pharmacy TNM Diabetes Measure Review  S:  Patient was identified in a report as being at risk for failing the True [place] Metric of A1c control (<8%) in [place] and African American patients. Last A1c was 11. Last PCP visit was in 07/2022.  Call placed to patient to discuss diabetes control and medication management. Was unable to reach patient, so I left a HIPAA-compliant VM with instructions to return my call.   Current diabetes medications include: Novolin 70/30 20u BID, metformin 750 mg XR BID, Ozempic 0.5 mg weekly  Dispense hx:  -Novolin 70/30 last dispensed 03/31/2023 for a 30-day -Metformin 750 XR: 01/27/2023 for a 90-day -Semgalutide: not filled since 08/24/2022   Insurance coverage: [place] [employer]  O:   Lab Results  Component Value Date   HGBA1C 11.0 (A) 08/24/2022   There were no vitals filed for this visit.  Lipid Panel     Component Value Date/Time   CHOL 142 07/31/2020 1348   TRIG 38 07/31/2020 1348   HDL 37 (L) 07/31/2020 1348   CHOLHDL 3.8 07/31/2020 1348   LDLCALC 96 07/31/2020 1348    Clinical Atherosclerotic Cardiovascular Disease (ASCVD): No  The ASCVD Risk score (Arnett DK, et al., 2019) failed to calculate for the following reasons:   The 2019 ASCVD risk score is only valid for ages 28 to 72   Patient is participating in a Managed Medicaid Plan: No   A/P: Diabetes longstanding currently uncontrolled. He is due an appt and an A1c. Unfortunately, I was unable to reach him but I did leave a HIPAA-compliant VM with instructions to return my call/schedule an appt. Of note, his dispense hx shows pretty good adherence to metformin and Novolin 70/30. Is not taking Ozempic.  -Next A1c: needs updated level ASAP.   Butch Penny, PharmD, [place]y [place], CPP Clinical Pharmacist [facility] & [facility] [phone]

## 2023-04-02 ENCOUNTER — Other Ambulatory Visit: Payer: Self-pay | Admitting: Family Medicine

## 2023-04-02 DIAGNOSIS — Z794 Long term (current) use of insulin: Secondary | ICD-10-CM

## 2023-04-05 ENCOUNTER — Other Ambulatory Visit: Payer: Self-pay | Admitting: Pharmacist

## 2023-04-05 ENCOUNTER — Ambulatory Visit: Attending: Nurse Practitioner

## 2023-04-05 DIAGNOSIS — E1165 Type 2 diabetes mellitus with hyperglycemia: Secondary | ICD-10-CM

## 2023-04-06 LAB — HEMOGLOBIN A1C
Est. average glucose Bld gHb Est-mCnc: 134 mg/dL
Hgb A1c MFr Bld: 6.3 % — ABNORMAL HIGH (ref 4.8–5.6)

## 2023-04-11 ENCOUNTER — Encounter: Payer: Self-pay | Admitting: Nurse Practitioner

## 2023-04-21 ENCOUNTER — Ambulatory Visit: Attending: Nurse Practitioner | Admitting: Nurse Practitioner

## 2023-04-21 ENCOUNTER — Other Ambulatory Visit: Payer: Self-pay

## 2023-04-21 ENCOUNTER — Encounter: Payer: Self-pay | Admitting: Nurse Practitioner

## 2023-04-21 VITALS — BP 135/84 | HR 92 | Resp 20 | Ht 70.0 in | Wt 294.4 lb

## 2023-04-21 DIAGNOSIS — Z23 Encounter for immunization: Secondary | ICD-10-CM

## 2023-04-21 DIAGNOSIS — Z794 Long term (current) use of insulin: Secondary | ICD-10-CM

## 2023-04-21 DIAGNOSIS — D72829 Elevated white blood cell count, unspecified: Secondary | ICD-10-CM | POA: Diagnosis not present

## 2023-04-21 DIAGNOSIS — E785 Hyperlipidemia, unspecified: Secondary | ICD-10-CM

## 2023-04-21 DIAGNOSIS — I10 Essential (primary) hypertension: Secondary | ICD-10-CM

## 2023-04-21 DIAGNOSIS — B353 Tinea pedis: Secondary | ICD-10-CM

## 2023-04-21 DIAGNOSIS — B351 Tinea unguium: Secondary | ICD-10-CM

## 2023-04-21 DIAGNOSIS — E1165 Type 2 diabetes mellitus with hyperglycemia: Secondary | ICD-10-CM

## 2023-04-21 DIAGNOSIS — L602 Onychogryphosis: Secondary | ICD-10-CM

## 2023-04-21 MED ORDER — CLOTRIMAZOLE 1 % EX CREA
1.0000 | TOPICAL_CREAM | Freq: Two times a day (BID) | CUTANEOUS | 0 refills | Status: DC
Start: 2023-04-21 — End: 2023-11-01

## 2023-04-21 NOTE — Progress Notes (Signed)
 I have seen and examined this patient with the advanced practice provider STUDENT and agree with the note below

## 2023-04-21 NOTE — Progress Notes (Signed)
 Assessment & Plan:  Jose Burgess was seen today for medical management of chronic issues.  Diagnoses and all orders for this visit:   Primary hypertension -     CMP14+EGFR Blood pressure elevated at 140/83 mmHg, blood rechecked 135/85 mmHg.  Check blood pressure at home  Dyslipidemia, goal LDL below 70 -     Lipid Panel Recommendation for DASH/Mediterranean diet and exercise/lift weights 3-5 times per week for at least 30 minutes.  Leukocytosis, unspecified type -     CBC with Differential  Toenail fungus -     Ambulatory referral to Podiatry  Overgrown toenails -     Ambulatory referral to Podiatry  Tinea pedis of both feet -     Ambulatory referral to Podiatry -     clotrimazole (CLOTRIMAZOLE ANTI-FUNGAL) 1 % cream; Apply 1 Application topically 2 (two) times daily. Educated patient to keep feet clean and dry. Sanitize shoes, socks and shower with disinfectant that will eliminate fungus   Need for pneumococcal 20-valent conjugate vaccination -     Pneumococcal conjugate vaccine 20-valent -     Flu vaccine trivalent PF, 6mos and older(Flulaval,Afluria,Fluarix,Fluzone)  Need for influenza vaccination -     Flu vaccine trivalent PF, 6mos and older(Flulaval,Afluria,Fluarix,Fluzone)  Type 2 diabetes mellitus with hyperglycemia, with long-term current use of insulin (HCC) -     Microalbumin/Creatinine Ratio, Urine Continue medications as prescribed  Patient has been counseled on age-appropriate routine health concerns for screening and prevention. These are reviewed and up-to-date. Referrals have been placed accordingly. Immunizations are up-to-date or declined.    Subjective:   Chief Complaint  Patient presents with   Medical Management of Chronic Issues    Jose Burgess 38 y.o. male presents to office today for routine follow up to hypertension. Blood pressure elevated at 140/83 mmHg, blood rechecked 135/85 mmHg. Recommendation for DASH/Mediterranean diet and exercise/lift  weights 3-5 times per week for at least 30 minutes. Continue taking Lisinopril 5 mg every day and check blood pressure at home.    States his toenails are cracking and feet are dry with pruritus and scaly skin. Using OTC cream for athlete's feet with minimal relief. Referral sent to Podiatry. Educated patient to keep feet clean and dry. Sanitize shoes, socks and shower. Prescriptions sent for Clotrimazole cream 1% to apply to affected areas.     Review of Systems  Constitutional: Negative.   HENT: Negative.    Eyes: Negative.   Respiratory: Negative.    Cardiovascular: Negative.   Gastrointestinal: Negative.   Genitourinary: Negative.   Skin:  Positive for itching and rash.       Bilateral feet  Neurological: Negative.   Endo/Heme/Allergies: Negative.   Psychiatric/Behavioral: Negative.      Past Medical History:  Diagnosis Date   Anxiety    Depression    Diabetes 1.5, managed as type 2 (HCC)    Hypertension    Obese     Past Surgical History:  Procedure Laterality Date   NO PAST SURGERIES      Family History  Problem Relation Age of Onset   Anxiety disorder Mother    Hyperlipidemia Mother    Diabetes Mother    Heart disease Father    Hyperlipidemia Father    Diabetes Father     Social History Reviewed with no changes to be made today.   Outpatient Medications Prior to Visit  Medication Sig Dispense Refill   albuterol (VENTOLIN HFA) 108 (90 Base) MCG/ACT inhaler Inhale 1-2 puffs into  the lungs every 6 (six) hours as needed. 8 g 0   atorvastatin (LIPITOR) 20 MG tablet Take 1 tablet (20 mg total) by mouth daily. 90 tablet 3   cetirizine (ZYRTEC) 10 MG tablet TAKE 1 TABLET BY MOUTH DAILY AS NEEDED FOR ALLERGIES OR RHINITIS. 90 tablet 0   fluticasone (FLOVENT HFA) 44 MCG/ACT inhaler Inhale 2 puffs into the lungs daily as needed (shortness of breath). 1 each 1   insulin isophane & regular human KwikPen (NOVOLIN 70/30 KWIKPEN) (70-30) 100 UNIT/ML KwikPen Inject 20  Units into the skin 2 (two) times daily. 10 mL 2   Insulin Pen Needle 31G X 5 MM MISC Use as instructed. Inject into the skin once daily 100 each 6   lisinopril (ZESTRIL) 5 MG tablet TAKE 1 TABLET (5 MG TOTAL) BY MOUTH DAILY. 90 tablet 0   metFORMIN (GLUCOPHAGE-XR) 750 MG 24 hr tablet Take 1 tablet (750 mg total) by mouth in the morning and at bedtime. 180 tablet 1   Semaglutide,0.25 or 0.5MG /DOS, (OZEMPIC, 0.25 OR 0.5 MG/DOSE,) 2 MG/3ML SOPN Inject 0.5 mg into the skin once a week. 3 mL 1   benzonatate (TESSALON) 100 MG capsule Take 1-2 capsules (100-200 mg total) by mouth 3 (three) times daily as needed. (Patient not taking: Reported on 04/21/2023) 30 capsule 0   diclofenac (VOLTAREN) 75 MG EC tablet Take 1 tablet (75 mg total) by mouth 2 (two) times daily. (Patient not taking: Reported on 04/21/2023) 60 tablet 1   fluticasone-salmeterol (ADVAIR) 100-50 MCG/ACT AEPB Inhale 1 puff into the lungs 2 (two) times daily. (Patient not taking: Reported on 04/21/2023) 1 each 3   sertraline (ZOLOFT) 25 MG tablet Take 1 tablet (25 mg total) by mouth daily. (Patient not taking: Reported on 04/21/2023) 90 tablet 3   sildenafil (VIAGRA) 100 MG tablet TAKE 0.5-1 TABLETS (50-100 MG TOTAL) BY MOUTH DAILY AS NEEDED FOR ERECTILE DYSFUNCTION. (Patient not taking: Reported on 03/18/2022) 10 tablet 11   No facility-administered medications prior to visit.    No Known Allergies     Objective:    BP 135/84 (BP Location: Left Arm, Patient Position: Sitting, Cuff Size: Normal)   Pulse 92   Resp 20   Ht 5\' 10"  (1.778 m)   Wt 294 lb 6.4 oz (133.5 kg)   SpO2 97%   BMI 42.24 kg/m  Wt Readings from Last 3 Encounters:  04/21/23 294 lb 6.4 oz (133.5 kg)  08/24/22 295 lb 12.8 oz (134.2 kg)  03/18/22 293 lb (132.9 kg)   BP Readings from Last 3 Encounters:  04/21/23 135/84  08/24/22 130/82  03/18/22 136/81    Lab Results  Component Value Date   HGBA1C 6.3 (H) 04/05/2023    Physical Exam Vitals and nursing note  reviewed.  Constitutional:      Appearance: Normal appearance.  HENT:     Head: Normocephalic.     Nose: Nose normal.  Cardiovascular:     Rate and Rhythm: Normal rate and regular rhythm.     Heart sounds: Normal heart sounds.  Pulmonary:     Effort: Pulmonary effort is normal.     Breath sounds: Normal breath sounds.  Musculoskeletal:        General: Normal range of motion.     Cervical back: Normal range of motion.  Feet:     Right foot:     Skin integrity: Dry skin present. No skin breakdown.     Toenail Condition: Right toenails are abnormally thick. Fungal disease  present.    Left foot:     Skin integrity: Dry skin present. No skin breakdown.     Toenail Condition: Left toenails are abnormally thick and long. Fungal disease present. Skin:    General: Skin is warm and dry.  Neurological:     General: No focal deficit present.     Mental Status: He is alert and oriented to person, place, and time.  Psychiatric:        Attention and Perception: Attention normal.        Mood and Affect: Mood normal.        Speech: Speech normal.        Behavior: Behavior normal. Behavior is cooperative.        Thought Content: Thought content normal.        Cognition and Memory: Cognition normal.        Judgment: Judgment normal.         Patient has been counseled extensively about nutrition and exercise as well as the importance of adherence with medications and regular follow-up. The patient was given clear instructions to go to ER or return to medical center if symptoms don't improve, worsen or new problems develop. The patient verbalized understanding.   Follow-up: Return in about 3 months (around 07/22/2023).   Joette Catching, FNP-BC [facility] and [facility] [place], [place] [phone]   04/21/2023, 4:54 PM

## 2023-04-22 LAB — LIPID PANEL
Chol/HDL Ratio: 3.3 ratio (ref 0.0–5.0)
Cholesterol, Total: 117 mg/dL (ref 100–199)
HDL: 36 mg/dL — ABNORMAL LOW (ref >39)
LDL Chol Calc (NIH): 68 mg/dL (ref 0–99)
Triglycerides: 58 mg/dL (ref 0–149)
VLDL Cholesterol Cal: 13 mg/dL (ref 5–40)

## 2023-04-22 LAB — MICROALBUMIN / CREATININE URINE RATIO
Creatinine, Urine: 125.1 mg/dL
Microalb/Creat Ratio: 3 mg/g{creat} (ref 0–29)
Microalbumin, Urine: 3.5 ug/mL

## 2023-04-22 LAB — CMP14+EGFR
ALT: 26 IU/L (ref 0–44)
AST: 24 IU/L (ref 0–40)
Albumin: 4.8 g/dL (ref 4.1–5.1)
Alkaline Phosphatase: 80 IU/L (ref 44–121)
BUN/Creatinine Ratio: 15 (ref 9–20)
BUN: 15 mg/dL (ref 6–20)
Bilirubin Total: 0.6 mg/dL (ref 0.0–1.2)
CO2: 23 mmol/L (ref 20–29)
Calcium: 9.8 mg/dL (ref 8.7–10.2)
Chloride: 102 mmol/L (ref 96–106)
Creatinine, Ser: 1.01 mg/dL (ref 0.76–1.27)
Globulin, Total: 2.5 g/dL (ref 1.5–4.5)
Glucose: 72 mg/dL (ref 70–99)
Potassium: 3.8 mmol/L (ref 3.5–5.2)
Sodium: 142 mmol/L (ref 134–144)
Total Protein: 7.3 g/dL (ref 6.0–8.5)
eGFR: 99 mL/min/{1.73_m2} (ref >59)

## 2023-04-22 LAB — CBC WITH DIFFERENTIAL/PLATELET
Basophils Absolute: 0.1 10*3/uL (ref 0.0–0.2)
Basos: 1 %
EOS (ABSOLUTE): 0.2 10*3/uL (ref 0.0–0.4)
Eos: 1 %
Hematocrit: 41.5 % (ref 37.5–51.0)
Hemoglobin: 13.4 g/dL (ref 13.0–17.7)
Immature Grans (Abs): 0 10*3/uL (ref 0.0–0.1)
Immature Granulocytes: 0 %
Lymphocytes Absolute: 3.6 10*3/uL — ABNORMAL HIGH (ref 0.7–3.1)
Lymphs: 27 %
MCH: 30 pg (ref 26.6–33.0)
MCHC: 32.3 g/dL (ref 31.5–35.7)
MCV: 93 fL (ref 79–97)
Monocytes Absolute: 1.3 10*3/uL — ABNORMAL HIGH (ref 0.1–0.9)
Monocytes: 9 %
Neutrophils Absolute: 8.2 10*3/uL — ABNORMAL HIGH (ref 1.4–7.0)
Neutrophils: 62 %
Platelets: 365 10*3/uL (ref 150–450)
RBC: 4.46 x10E6/uL (ref 4.14–5.80)
RDW: 11.6 % (ref 11.6–15.4)
WBC: 13.3 10*3/uL — ABNORMAL HIGH (ref 3.4–10.8)

## 2023-04-23 ENCOUNTER — Encounter: Payer: Self-pay | Admitting: Nurse Practitioner

## 2023-04-23 ENCOUNTER — Other Ambulatory Visit: Payer: Self-pay | Admitting: Nurse Practitioner

## 2023-04-23 DIAGNOSIS — D72829 Elevated white blood cell count, unspecified: Secondary | ICD-10-CM

## 2023-04-23 DIAGNOSIS — E785 Hyperlipidemia, unspecified: Secondary | ICD-10-CM

## 2023-05-04 ENCOUNTER — Other Ambulatory Visit: Payer: Self-pay | Admitting: Family Medicine

## 2023-05-04 DIAGNOSIS — E1165 Type 2 diabetes mellitus with hyperglycemia: Secondary | ICD-10-CM

## 2023-05-05 ENCOUNTER — Ambulatory Visit (INDEPENDENT_AMBULATORY_CARE_PROVIDER_SITE_OTHER): Admitting: Podiatry

## 2023-05-05 DIAGNOSIS — M79674 Pain in right toe(s): Secondary | ICD-10-CM

## 2023-05-05 DIAGNOSIS — B351 Tinea unguium: Secondary | ICD-10-CM | POA: Diagnosis not present

## 2023-05-05 DIAGNOSIS — M79675 Pain in left toe(s): Secondary | ICD-10-CM

## 2023-05-05 NOTE — Progress Notes (Signed)
   Chief Complaint  Patient presents with   Ingrown Toenail    RM#9 Left foot big toe ingrown nail also needs nail trim. Patient is diabetic states blood sugars are under control.    SUBJECTIVE Patient with a history of diabetes mellitus presents to office today complaining of elongated, thickened nails that cause pain while ambulating in shoes.  Patient is unable to trim their own nails. Patient is here for further evaluation and treatment.  Past Medical History:  Diagnosis Date   Anxiety    Depression    Diabetes 1.5, managed as type 2 (HCC)    Hypertension    Obese     No Known Allergies   OBJECTIVE General Patient is awake, alert, and oriented x 3 and in no acute distress. Derm Skin is dry and supple bilateral. Negative open lesions or macerations. Remaining integument unremarkable. Nails are tender, long, thickened and dystrophic with subungual debris, consistent with onychomycosis, 1-5 bilateral. No signs of infection noted. Vasc  DP and PT pedal pulses palpable bilaterally. Temperature gradient within normal limits.  Neuro Epicritic and protective threshold sensation diminished bilaterally.  Musculoskeletal Exam No symptomatic pedal deformities noted bilateral. Muscular strength within normal limits.  ASSESSMENT 1. Diabetes Mellitus w/ peripheral neuropathy 2.  Pain due to onychomycosis of toenails bilateral  PLAN OF CARE 1. Patient evaluated today.  Comprehensive diabetic foot exam performed today 2. Instructed to maintain good pedal hygiene and foot care. Stressed importance of controlling blood sugar.  3. Mechanical debridement of nails 1-5 bilaterally performed using a nail nipper. Filed with dremel without incident.  4. Return to clinic as needed    Felecia Shelling, DPM [facility]  Dr. Felecia Shelling, DPM    [address] [place], [place] [address]                Office [phone]  Fax [phone]

## 2023-05-12 ENCOUNTER — Other Ambulatory Visit: Payer: Self-pay | Admitting: Pharmacist

## 2023-05-12 DIAGNOSIS — E1165 Type 2 diabetes mellitus with hyperglycemia: Secondary | ICD-10-CM

## 2023-05-12 MED ORDER — NOVOLIN 70/30 FLEXPEN (70-30) 100 UNIT/ML ~~LOC~~ SUPN: 20.0000 [IU] | PEN_INJECTOR | Freq: Two times a day (BID) | SUBCUTANEOUS | 0 refills | Status: DC

## 2023-05-12 NOTE — Telephone Encounter (Signed)
 [place] could you take a look at this please

## 2023-05-13 NOTE — Telephone Encounter (Signed)
 Reached out to pt to clarify message pt states it ws due to the dosage of the insulin. Made pt aware that the clinical pharmacist has resent rx for correct dosage to [facility] on [address]. Pt states he appreciates it and doesn't have any questions or concerns

## 2023-05-19 ENCOUNTER — Other Ambulatory Visit: Payer: Self-pay | Admitting: Nurse Practitioner

## 2023-05-19 ENCOUNTER — Other Ambulatory Visit: Payer: Self-pay

## 2023-05-19 ENCOUNTER — Ambulatory Visit: Attending: Nurse Practitioner

## 2023-05-19 DIAGNOSIS — Z794 Long term (current) use of insulin: Secondary | ICD-10-CM

## 2023-05-19 DIAGNOSIS — D72829 Elevated white blood cell count, unspecified: Secondary | ICD-10-CM

## 2023-05-19 MED ORDER — OZEMPIC (0.25 OR 0.5 MG/DOSE) 2 MG/3ML ~~LOC~~ SOPN
0.5000 mg | PEN_INJECTOR | SUBCUTANEOUS | 1 refills | Status: DC
  Filled 2023-05-19: qty 3, 28d supply, fill #0

## 2023-05-20 ENCOUNTER — Other Ambulatory Visit: Payer: Self-pay

## 2023-05-20 ENCOUNTER — Encounter: Payer: Self-pay | Admitting: Nurse Practitioner

## 2023-05-20 LAB — CBC WITH DIFFERENTIAL/PLATELET
Basophils Absolute: 0.1 10*3/uL (ref 0.0–0.2)
Basos: 1 %
EOS (ABSOLUTE): 0.2 10*3/uL (ref 0.0–0.4)
Eos: 2 %
Hematocrit: 38.8 % (ref 37.5–51.0)
Hemoglobin: 12.9 g/dL — ABNORMAL LOW (ref 13.0–17.7)
Immature Grans (Abs): 0 10*3/uL (ref 0.0–0.1)
Immature Granulocytes: 0 %
Lymphocytes Absolute: 2.3 10*3/uL (ref 0.7–3.1)
Lymphs: 20 %
MCH: 30.5 pg (ref 26.6–33.0)
MCHC: 33.2 g/dL (ref 31.5–35.7)
MCV: 92 fL (ref 79–97)
Monocytes Absolute: 1.1 10*3/uL — ABNORMAL HIGH (ref 0.1–0.9)
Monocytes: 10 %
Neutrophils Absolute: 7.7 10*3/uL — ABNORMAL HIGH (ref 1.4–7.0)
Neutrophils: 67 %
Platelets: 312 10*3/uL (ref 150–450)
RBC: 4.23 x10E6/uL (ref 4.14–5.80)
RDW: 11.5 % — ABNORMAL LOW (ref 11.6–15.4)
WBC: 11.5 10*3/uL — ABNORMAL HIGH (ref 3.4–10.8)

## 2023-06-01 ENCOUNTER — Other Ambulatory Visit: Payer: Self-pay

## 2023-06-17 ENCOUNTER — Other Ambulatory Visit: Payer: Self-pay | Admitting: Nurse Practitioner

## 2023-06-17 DIAGNOSIS — E119 Type 2 diabetes mellitus without complications: Secondary | ICD-10-CM

## 2023-07-03 ENCOUNTER — Other Ambulatory Visit: Payer: Self-pay | Admitting: Family Medicine

## 2023-07-03 DIAGNOSIS — E1165 Type 2 diabetes mellitus with hyperglycemia: Secondary | ICD-10-CM

## 2023-07-05 ENCOUNTER — Telehealth: Payer: Self-pay

## 2023-07-05 NOTE — Telephone Encounter (Signed)
 Please see patient request below.     Copied from CRM [id number]. Topic: Clinical - Prescription Issue >> Jul 05, 2023  1:31 PM Jose Burgess wrote: Reason for CRM: insulin  isophane & regular human KwikPen (NOVOLIN  70/30 KWIKPEN) (70-30) 100 UNIT/ML KwikPen, needs to be 20 units, he stated it went down to 10 and its causing the prescription to cost more

## 2023-07-06 ENCOUNTER — Encounter: Payer: Self-pay | Admitting: Nurse Practitioner

## 2023-07-20 ENCOUNTER — Telehealth: Payer: Self-pay | Admitting: Nurse Practitioner

## 2023-07-20 NOTE — Telephone Encounter (Signed)
Contacted pt confirmed appt

## 2023-07-21 ENCOUNTER — Encounter: Payer: Self-pay | Admitting: Nurse Practitioner

## 2023-07-21 ENCOUNTER — Ambulatory Visit: Attending: Nurse Practitioner | Admitting: Nurse Practitioner

## 2023-07-21 VITALS — BP 117/76 | HR 82 | Resp 19 | Ht 70.0 in | Wt 286.0 lb

## 2023-07-21 DIAGNOSIS — Z7985 Long-term (current) use of injectable non-insulin antidiabetic drugs: Secondary | ICD-10-CM

## 2023-07-21 DIAGNOSIS — Z139 Encounter for screening, unspecified: Secondary | ICD-10-CM

## 2023-07-21 DIAGNOSIS — Z23 Encounter for immunization: Secondary | ICD-10-CM

## 2023-07-21 DIAGNOSIS — I10 Essential (primary) hypertension: Secondary | ICD-10-CM | POA: Diagnosis not present

## 2023-07-21 DIAGNOSIS — E119 Type 2 diabetes mellitus without complications: Secondary | ICD-10-CM

## 2023-07-21 MED ORDER — EMPAGLIFLOZIN 10 MG PO TABS: 10.0000 mg | ORAL_TABLET | Freq: Every day | ORAL | 1 refills | Status: AC

## 2023-07-21 MED ORDER — SEMAGLUTIDE (1 MG/DOSE) 4 MG/3ML ~~LOC~~ SOPN: 1.0000 mg | PEN_INJECTOR | SUBCUTANEOUS | 6 refills | Status: AC

## 2023-07-21 MED ORDER — LISINOPRIL 5 MG PO TABS: 5.0000 mg | ORAL_TABLET | Freq: Every day | ORAL | 1 refills | Status: AC

## 2023-07-21 NOTE — Progress Notes (Signed)
 Assessment & Plan:  [name] was seen today for hypertension.  Diagnoses and all orders for this visit:  Primary hypertension  Diabetes mellitus treated with injections of non-insulin  medication (HCC) -     lisinopril  (ZESTRIL ) 5 MG tablet; Take 1 tablet (5 mg total) by mouth daily. -     empagliflozin (JARDIANCE) 10 MG TABS tablet; Take 1 tablet (10 mg total) by mouth daily before breakfast. -     CMP14+EGFR; Future -     Semaglutide , 1 MG/DOSE, 4 MG/3ML SOPN; Inject 1 mg as directed once a week. -     Hemoglobin A1c; Future  Need for hepatitis B vaccination -     Heplisav-B (HepB-CPG) Vaccine  Encounter for screening involving social determinants of health (SDoH) -     AMB Referral VBCI Care Management    Patient has been counseled on age-appropriate routine health concerns for screening and prevention. These are reviewed and up-to-date. Referrals have been placed accordingly. Immunizations are up-to-date or declined.    Subjective:   Chief Complaint  Patient presents with   Hypertension    Jose Burgess 38 y.o. male presents to office today for follow-up to Hypertension.   He has a past medical history of type 2 diabetes, depression and anxiety  HTN Blood pressure is well-controlled with renal dose ACE. BP Readings from Last 3 Encounters:  07/21/23 117/76  04/21/23 135/84  08/24/22 130/82     DM 2 He states metformin  is causing him to have frequent bowel movements.  He would like to taking this.  At this time we will increase Ozempic  to 1 mg weekly and start Jardiance 10 mg daily.  He will continue on insulin  20 units twice daily however we will decrease dose of insulin  based on home glucose readings   which he will submit through MyChart Lab Results  Component Value Date   HGBA1C 6.3 (H) 04/05/2023     Review of Systems  Constitutional:  Negative for fever, malaise/fatigue and weight loss.  HENT: Negative.  Negative for nosebleeds.   Eyes: Negative.   Negative for blurred vision, double vision and photophobia.  Respiratory: Negative.  Negative for cough and shortness of breath.   Cardiovascular: Negative.  Negative for chest pain, palpitations and leg swelling.  Gastrointestinal: Negative.  Negative for heartburn, nausea and vomiting.  Musculoskeletal: Negative.  Negative for myalgias.  Neurological: Negative.  Negative for dizziness, focal weakness, seizures and headaches.  Psychiatric/Behavioral: Negative.  Negative for suicidal ideas.     Past Medical History:  Diagnosis Date   Anxiety    Depression    Diabetes 1.5, managed as type 2 (HCC)    Hypertension    Obese     Past Surgical History:  Procedure Laterality Date   NO PAST SURGERIES      Family History  Problem Relation Age of Onset   Anxiety disorder Mother    Hyperlipidemia Mother    Diabetes Mother    Heart disease Father    Hyperlipidemia Father    Diabetes Father     Social History Reviewed with no changes to be made today.   Outpatient Medications Prior to Visit  Medication Sig Dispense Refill   albuterol  (VENTOLIN  HFA) 108 (90 Base) MCG/ACT inhaler Inhale 1-2 puffs into the lungs every 6 (six) hours as needed. 8 g 0   atorvastatin  (LIPITOR) 20 MG tablet TAKE 1 TABLET BY MOUTH EVERY DAY 90 tablet 3   fluticasone  (FLOVENT  HFA) 44 MCG/ACT inhaler Inhale 2  puffs into the lungs daily as needed (shortness of breath). 1 each 1   fluticasone -salmeterol (ADVAIR) 100-50 MCG/ACT AEPB Inhale 1 puff into the lungs 2 (two) times daily. 1 each 3   insulin  isophane & regular human KwikPen (NOVOLIN  70/30 KWIKPEN) (70-30) 100 UNIT/ML KwikPen INJECT 20 UNITS INTO THE SKIN IN THE MORNING AND AT BEDTIME. 15 mL 1   Insulin  Pen Needle 31G X 5 MM MISC Use as instructed. Inject into the skin once daily 100 each 6   sertraline  (ZOLOFT ) 25 MG tablet Take 1 tablet (25 mg total) by mouth daily. 90 tablet 3   cetirizine  (ZYRTEC ) 10 MG tablet TAKE 1 TABLET BY MOUTH DAILY AS NEEDED FOR  ALLERGIES OR RHINITIS. 90 tablet 0   lisinopril  (ZESTRIL ) 5 MG tablet TAKE 1 TABLET (5 MG TOTAL) BY MOUTH DAILY. 90 tablet 0   metFORMIN  (GLUCOPHAGE -XR) 750 MG 24 hr tablet Take 1 tablet (750 mg total) by mouth in the morning and at bedtime. 180 tablet 1   Semaglutide ,0.25 or 0.5MG /DOS, (OZEMPIC , 0.25 OR 0.5 MG/DOSE,) 2 MG/3ML SOPN Inject 0.5 mg into the skin once a week. 3 mL 1   sildenafil  (VIAGRA ) 100 MG tablet TAKE 0.5-1 TABLETS (50-100 MG TOTAL) BY MOUTH DAILY AS NEEDED FOR ERECTILE DYSFUNCTION. (Patient not taking: Reported on 07/21/2023) 10 tablet 11   benzonatate  (TESSALON ) 100 MG capsule Take 1-2 capsules (100-200 mg total) by mouth 3 (three) times daily as needed. (Patient not taking: Reported on 07/21/2023) 30 capsule 0   diclofenac  (VOLTAREN ) 75 MG EC tablet Take 1 tablet (75 mg total) by mouth 2 (two) times daily. (Patient not taking: Reported on 07/21/2023) 60 tablet 1   No facility-administered medications prior to visit.    No Known Allergies     Objective:    BP 117/76 (BP Location: Left Arm, Patient Position: Sitting, Cuff Size: Normal)   Pulse 82   Resp 19   Ht 5' 10 (1.778 m)   Wt 286 lb (129.7 kg)   SpO2 97%   BMI 41.04 kg/m  Wt Readings from Last 3 Encounters:  07/21/23 286 lb (129.7 kg)  04/21/23 294 lb 6.4 oz (133.5 kg)  08/24/22 295 lb 12.8 oz (134.2 kg)    Physical Exam Vitals and nursing note reviewed.  Constitutional:      Appearance: He is well-developed.  HENT:     Head: Normocephalic and atraumatic.   Cardiovascular:     Rate and Rhythm: Normal rate and regular rhythm.     Heart sounds: Normal heart sounds. No murmur heard.    No friction rub. No gallop.  Pulmonary:     Effort: Pulmonary effort is normal. No tachypnea or respiratory distress.     Breath sounds: Normal breath sounds. No decreased breath sounds, wheezing, rhonchi or rales.  Chest:     Chest wall: No tenderness.  Abdominal:     General: Bowel sounds are normal.     Palpations:  Abdomen is soft.   Musculoskeletal:        General: Normal range of motion.     Cervical back: Normal range of motion.   Skin:    General: Skin is warm and dry.     Comments: vitiligo   Neurological:     Mental Status: He is alert and oriented to person, place, and time.     Coordination: Coordination normal.   Psychiatric:        Behavior: Behavior normal. Behavior is cooperative.  Thought Content: Thought content normal.        Judgment: Judgment normal.          Patient has been counseled extensively about nutrition and exercise as well as the importance of adherence with medications and regular follow-up. The patient was given clear instructions to go to ER or return to medical center if symptoms don't improve, worsen or new problems develop. The patient verbalized understanding.   Follow-up: Return for 4-6 weeks labs. See me in 6 months.   Haze LELON Servant, FNP-BC [facility] [place], [place] [phone]   07/21/2023, 4:55 PM

## 2023-07-28 ENCOUNTER — Telehealth: Payer: Self-pay | Admitting: *Deleted

## 2023-07-28 NOTE — Progress Notes (Signed)
 Complex Care Management Note Care Guide Note  07/28/2023 Name: Jose Burgess MRN: [id number] DOB: 07-Jul-1985   Complex Care Management Outreach Attempts: An unsuccessful telephone outreach was attempted today to offer the patient information about available complex care management services.  Follow Up Plan:  Additional outreach attempts will be made to offer the patient complex care management information and services.   Encounter Outcome:  No Answer  [facility] HealthPopulation Health Care Guide  Direct Dial:[phone] Fax:[phone] Website: [email or web address]

## 2023-07-29 ENCOUNTER — Telehealth: Payer: Self-pay | Admitting: *Deleted

## 2023-07-29 NOTE — Progress Notes (Signed)
 Complex Care Management Note Care Guide Note  07/29/2023 Name: ARBY DAHIR MRN: [id number] DOB: 01/19/1986  Norleen VEAR Anthis is a 38 y.o. year old male who is a primary care patient of Theotis Haze ORN, NP . The community resource team was consulted for assistance with Food Insecurity, housing   SDOH screenings and interventions completed:  Yes     SDOH Interventions Today    Flowsheet Row Most Recent Value  SDOH Interventions   Food Insecurity Interventions Community Resources Provided, Assist with [employer] Application  [provided food stamp application]     Care guide performed the following interventions: Patient provided with information about care guide support team and interviewed to confirm resource needs.  Follow Up Plan:  No further follow up planned at this time. The patient has been provided with needed resources.  Encounter Outcome:  Patient Visit Completed  Iolani Twilley Greenauer-Moran  [facility] HealthPopulation Health Care Guide  Direct Dial:[phone] Fax:[phone] Website: [email or web address]

## 2023-09-03 ENCOUNTER — Ambulatory Visit: Admitting: Pharmacist

## 2023-09-09 ENCOUNTER — Telehealth: Payer: Self-pay | Admitting: Nurse Practitioner

## 2023-09-09 ENCOUNTER — Other Ambulatory Visit: Payer: Self-pay | Admitting: Nurse Practitioner

## 2023-09-09 DIAGNOSIS — Z794 Long term (current) use of insulin: Secondary | ICD-10-CM

## 2023-09-09 NOTE — Telephone Encounter (Signed)
 Jose Burgess Agricultural consultant confirmed appt for 8/15

## 2023-09-10 ENCOUNTER — Encounter: Payer: Self-pay | Admitting: Pharmacist

## 2023-09-10 ENCOUNTER — Ambulatory Visit: Attending: Nurse Practitioner | Admitting: Pharmacist

## 2023-09-10 ENCOUNTER — Telehealth: Payer: Self-pay | Admitting: Pharmacist

## 2023-09-10 DIAGNOSIS — Z7984 Long term (current) use of oral hypoglycemic drugs: Secondary | ICD-10-CM | POA: Diagnosis not present

## 2023-09-10 DIAGNOSIS — Z7985 Long-term (current) use of injectable non-insulin antidiabetic drugs: Secondary | ICD-10-CM

## 2023-09-10 DIAGNOSIS — E1165 Type 2 diabetes mellitus with hyperglycemia: Secondary | ICD-10-CM | POA: Diagnosis not present

## 2023-09-10 DIAGNOSIS — Z794 Long term (current) use of insulin: Secondary | ICD-10-CM | POA: Diagnosis not present

## 2023-09-10 LAB — POCT GLYCOSYLATED HEMOGLOBIN (HGB A1C): HbA1c, POC (controlled diabetic range): 5.9 % (ref 0.0–7.0)

## 2023-09-10 MED ORDER — DEXCOM G7 RECEIVER DEVI: 0 refills | Status: AC

## 2023-09-10 MED ORDER — DEXCOM G7 SENSOR MISC: 6 refills | Status: AC

## 2023-09-10 NOTE — Telephone Encounter (Signed)
 Can we start a PA application for this patient's Dexcom? He is on BID insulin  + Ozempic .

## 2023-09-10 NOTE — Progress Notes (Signed)
    S:     No chief complaint on file.  38 y.o. male who presents for diabetes evaluation, education, and management. Patient arrives in good spirits and presents without any assistance.  Patient was referred and last seen by Primary Care Provider, Haze Servant, on 07/21/2023.    PMH is significant for T2DM (current A1c of 5.9%), HTN, obesity, MDD. Patient reports Diabetes is longstanding. I contacted him and left a VM with instructions to return my call to schedule an appt for DM management. His A1c at that time was 11 but the date of that result was 07/2022. I placed an order for him to come in and obtain an updated A1c. He did so on 04/05/2023 and that result showed good DM control at 6.3%.   Since then, he was seen 07/21/2023 by his PCP. At that visit, metformin  was stopped due frequent bowel movements. Ozempic  dose was increased to 1 mg weekly. Jardiance  was added to his regimen. His insulin  was continued at 20 units BID  Family/Social History:  -Fhx: HLD, DM, heart disease -Tobacco: never smoker  -Alcohol: none reported   Current diabetes medications include: empagliflozin  10 mg daily, NPH 70/30 20 units BID, Ozempic  1 mg weekly Patient reports adherence to taking all medications as prescribed.   Insurance coverage: BCBS  Patient denies hypoglycemic events.  Reported home fasting blood sugars: none reported. Interested in CGM.   Reported 2 hour post-meal/random blood sugars: none reported. Interested in CGM.  Patient denies nocturia (nighttime urination).  Patient denies neuropathy (nerve pain). Patient denies visual changes. Patient reports self foot exams.   O:   Lab Results  Component Value Date   HGBA1C 5.9 09/10/2023   There were no vitals filed for this visit.  Lipid Panel     Component Value Date/Time   CHOL 117 04/21/2023 1647   TRIG 58 04/21/2023 1647   HDL 36 (L) 04/21/2023 1647   CHOLHDL 3.3 04/21/2023 1647   LDLCALC 68 04/21/2023 1647    Clinical  Atherosclerotic Cardiovascular Disease (ASCVD): No  The ASCVD Risk score (Arnett DK, et al., 2019) failed to calculate for the following reasons:   The 2019 ASCVD risk score is only valid for ages 26 to 22   Patient is participating in a Managed Medicaid Plan: No   A/P: Diabetes longstanding currently controlled given his A1c today of 5.9. Commended him for this! He is not currently symptomatic. He is not experiencing any hypoglycemia currently but is able to verbalize appropriate hypoglycemia management plan. Medication adherence is optimal. Signed him up for a copay card to assist with Jardiance  copay.  -Continued current regimen. -Patient educated on purpose, proper use, and potential adverse effects of Jardiance , insulin , Ozempic .  -Extensively discussed pathophysiology of diabetes, recommended lifestyle interventions, dietary effects on blood sugar control.  -Counseled on s/sx of and management of hypoglycemia.  -Next A1c anticipated 11/2023.   Written patient instructions provided. Patient verbalized understanding of treatment plan.  Total time in face to face counseling 30 minutes.    Follow-up:  Pharmacist 3 months.   Herlene Fleeta Morris, PharmD, JAQUELINE, CPP Clinical Pharmacist [facility] [phone]

## 2023-09-13 ENCOUNTER — Other Ambulatory Visit: Payer: Self-pay

## 2023-10-20 ENCOUNTER — Other Ambulatory Visit (HOSPITAL_BASED_OUTPATIENT_CLINIC_OR_DEPARTMENT_OTHER): Payer: Self-pay | Admitting: Pharmacist

## 2023-10-20 DIAGNOSIS — Z7984 Long term (current) use of oral hypoglycemic drugs: Secondary | ICD-10-CM

## 2023-10-20 DIAGNOSIS — Z794 Long term (current) use of insulin: Secondary | ICD-10-CM

## 2023-10-20 DIAGNOSIS — E119 Type 2 diabetes mellitus without complications: Secondary | ICD-10-CM

## 2023-10-20 NOTE — Progress Notes (Signed)
 Pharmacy Quality Measure Review  I've seen this patient before for DM control. Reviewed his dispense record today for diabetes medications filled recently.    Medication: Jardiance  Last fill date: 08/01/23 for 90 day supply  Medication: Novolin  70/30  Last fill date: 10/16/2023 for 34 day supply  Medication: Ozempic  Last fill date: 08/01/23 for 28 day supply  Medication were up to date with the exception of Ozempic . I called the patient and he tells me sometimes cost limits what he can get. Even without the Ozempic , he has been able to take the Jardiance  and insulin  as prescribed and tells me his sugars are looking good.   Reminded him of his appt on 12/13/23. He will call me with any requests before then if needed.   Herlene Fleeta Morris, PharmD, JAQUELINE, CPP Clinical Pharmacist [facility] [phone]

## 2023-10-31 ENCOUNTER — Other Ambulatory Visit: Payer: Self-pay | Admitting: Nurse Practitioner

## 2023-10-31 DIAGNOSIS — B353 Tinea pedis: Secondary | ICD-10-CM

## 2023-12-12 NOTE — Progress Notes (Unsigned)
    S:     No chief complaint on file.  38 y.o. male who presents for diabetes evaluation, education, and management. Patient arrives in good spirits and presents without any assistance.  Patient was referred and last seen by Primary Care Provider, Haze Servant, on 07/21/2023. At that visit, metformin  was stopped due frequent bowel movements. Ozempic  dose was increased to 1 mg weekly. Jardiance  was added to his regimen. His insulin  was continued at 20 units BID.  PMH is significant for T2DM, HTN, obesity, MDD.   I last saw him on 09/10/2023. His A1c at that time was 5.9% (down from 6.3% prior). No changes were made during that visit.  Today, patient arrives in good spirits without any assistance. Patient reports that he's doing well. Has been checking sugars occasionally at home with good results. No log of CGM/GM with him today. He denies any polyuria, polydipsia or changes in vision. He has been consistent with his insulin  but has been without Ozempic  and Jardiance  for a couple of weeks. Tolerated both of these well when he was taking previously.   Family/Social History:  -Fhx: HLD, DM, heart disease -Tobacco: never smoker  -Alcohol: none reported   Current diabetes medications include: empagliflozin  10 mg daily (not taking), NPH 70/30 20 units BID, Ozempic  1 mg weekly (not taking) Patient reports adherence to taking all medications as prescribed.   Insurance coverage: BCBS  Patient denies hypoglycemic events.  Reported home fasting blood sugars: none reported. Interested in CGM.   Reported 2 hour post-meal/random blood sugars: none reported. Interested in CGM.  Patient denies nocturia (nighttime urination).  Patient denies neuropathy (nerve pain). Patient denies visual changes. Patient reports self foot exams.   O:   Lab Results  Component Value Date   HGBA1C 5.9 09/10/2023   There were no vitals filed for this visit.  Lipid Panel     Component Value Date/Time   CHOL  117 04/21/2023 1647   TRIG 58 04/21/2023 1647   HDL 36 (L) 04/21/2023 1647   CHOLHDL 3.3 04/21/2023 1647   LDLCALC 68 04/21/2023 1647    Clinical Atherosclerotic Cardiovascular Disease (ASCVD): No  The ASCVD Risk score (Arnett DK, et al., 2019) failed to calculate for the following reasons:   The 2019 ASCVD risk score is only valid for ages 36 to 70   Patient is participating in a Managed Medicaid Plan: No   A/P: Diabetes longstanding currently controlled given his A1c today of 6.5. Commended him for this! He is not currently symptomatic. He is not experiencing any hypoglycemia currently but is able to verbalize appropriate hypoglycemia management plan. Medication adherence is suboptimal.  -Continued current regimen. -Restart Ozempic  at 0.5 mg weekly for 4 weeks, then increase to 1 mg weekly thereafter.  -Can continue NPH 20 units BID for now. May need to decrease in the future if hypoglycemia occurs with Ozempic .  -Restart Jardiance  10 mg daily.  -Patient educated on purpose, proper use, and potential adverse effects of Jardiance , insulin , Ozempic .  -Extensively discussed pathophysiology of diabetes, recommended lifestyle interventions, dietary effects on blood sugar control.  -Counseled on s/sx of and management of hypoglycemia.  -Next A1c anticipated 02/2024  Written patient instructions provided. Patient verbalized understanding of treatment plan.  Total time in face to face counseling 30 minutes.    Follow-up:  Pharmacist 03/14/2024 PCP visit with Zelda 01/21/2024  Herlene Fleeta Morris, PharmD, JAQUELINE, CPP Clinical Pharmacist [facility] [phone]

## 2023-12-13 ENCOUNTER — Encounter: Payer: Self-pay | Admitting: Pharmacist

## 2023-12-13 ENCOUNTER — Ambulatory Visit: Attending: Family Medicine | Admitting: Pharmacist

## 2023-12-13 DIAGNOSIS — Z7984 Long term (current) use of oral hypoglycemic drugs: Secondary | ICD-10-CM

## 2023-12-13 DIAGNOSIS — Z7985 Long-term (current) use of injectable non-insulin antidiabetic drugs: Secondary | ICD-10-CM | POA: Diagnosis not present

## 2023-12-13 DIAGNOSIS — Z794 Long term (current) use of insulin: Secondary | ICD-10-CM | POA: Diagnosis not present

## 2023-12-13 DIAGNOSIS — E1165 Type 2 diabetes mellitus with hyperglycemia: Secondary | ICD-10-CM | POA: Diagnosis not present

## 2023-12-13 LAB — POCT GLYCOSYLATED HEMOGLOBIN (HGB A1C): HbA1c, POC (controlled diabetic range): 6.5 % (ref 0.0–7.0)

## 2023-12-13 MED ORDER — OZEMPIC (0.25 OR 0.5 MG/DOSE) 2 MG/3ML ~~LOC~~ SOPN: 0.5000 mg | PEN_INJECTOR | SUBCUTANEOUS | 0 refills | Status: AC

## 2024-01-19 ENCOUNTER — Telehealth: Payer: Self-pay | Admitting: Nurse Practitioner

## 2024-01-19 NOTE — Telephone Encounter (Signed)
 Contacted pt left vm to confirmed appt

## 2024-01-21 ENCOUNTER — Ambulatory Visit: Admitting: Nurse Practitioner

## 2024-02-02 ENCOUNTER — Ambulatory Visit: Payer: Self-pay | Attending: Nurse Practitioner | Admitting: Nurse Practitioner

## 2024-02-02 ENCOUNTER — Encounter: Payer: Self-pay | Admitting: Nurse Practitioner

## 2024-02-02 VITALS — BP 118/83 | HR 111 | Ht 70.0 in | Wt 291.8 lb

## 2024-02-02 DIAGNOSIS — R7989 Other specified abnormal findings of blood chemistry: Secondary | ICD-10-CM | POA: Diagnosis not present

## 2024-02-02 DIAGNOSIS — I10 Essential (primary) hypertension: Secondary | ICD-10-CM

## 2024-02-02 DIAGNOSIS — Z794 Long term (current) use of insulin: Secondary | ICD-10-CM

## 2024-02-02 DIAGNOSIS — Z23 Encounter for immunization: Secondary | ICD-10-CM | POA: Diagnosis not present

## 2024-02-02 DIAGNOSIS — E1165 Type 2 diabetes mellitus with hyperglycemia: Secondary | ICD-10-CM | POA: Diagnosis not present

## 2024-02-02 NOTE — Progress Notes (Signed)
 "  Assessment & Plan:  Jose Burgess was seen today for hypertension.  Diagnoses and all orders for this visit:  Primary hypertension Continue all antihypertensives as prescribed.  Reminded to bring in blood pressure log for follow  up appointment.  RECOMMENDATIONS: DASH/Mediterranean Diets are healthier choices for HTN.    Type 2 diabetes mellitus with hyperglycemia, with long-term current use of insulin  (HCC) -     CMP14+EGFR Continue blood sugar control as discussed in office today, low carbohydrate diet, and regular physical exercise as tolerated, 150 minutes per week (30 min each day, 5 days per week, or 50 min 3 days per week). Keep blood sugar logs with fasting goal of 90-130 mg/dl, post prandial (after you eat) less than 180.  For Hypoglycemia: BS <60 and Hyperglycemia BS >400; contact the clinic ASAP. Annual eye exams and foot exams are recommended.   Abnormal CBC -     CBC with Differential  Need for influenza vaccination -     Flu vaccine trivalent PF, 6mos and older(Flulaval,Afluria,Fluarix,Fluzone)    Patient has been counseled on age-appropriate routine health concerns for screening and prevention. These are reviewed and up-to-date. Referrals have been placed accordingly. Immunizations are up-to-date or declined.    Subjective:   Chief Complaint  Patient presents with   Hypertension    Jose Burgess 39 y.o. male presents to office today for follow up to HTN  PMH: HTN, DM, Vitiligo, anxiety with depression,   HTN Blood pressure is well controlled. He is taking lisinopril  5 mg daily as prescribed.  BP Readings from Last 3 Encounters:  02/02/24 118/83  07/21/23 117/76  04/21/23 135/84    DM 2 A1c at goal. Currently taking ozempic  0.5 mg weekly and Jardiance  10 mg daily.  Lab Results  Component Value Date   HGBA1C 6.5 12/13/2023     Review of Systems  Constitutional:  Negative for fever, malaise/fatigue and weight loss.  HENT: Negative.  Negative for nosebleeds.    Eyes: Negative.  Negative for blurred vision, double vision and photophobia.  Respiratory: Negative.  Negative for cough and shortness of breath.   Cardiovascular: Negative.  Negative for chest pain, palpitations and leg swelling.  Gastrointestinal: Negative.  Negative for heartburn, nausea and vomiting.  Musculoskeletal: Negative.  Negative for myalgias.  Neurological: Negative.  Negative for dizziness, focal weakness, seizures and headaches.  Psychiatric/Behavioral: Negative.  Negative for suicidal ideas.     Past Medical History:  Diagnosis Date   Anxiety    Depression    Diabetes 1.5, managed as type 2 (HCC)    Hypertension    Obese     Past Surgical History:  Procedure Laterality Date   NO PAST SURGERIES      Family History  Problem Relation Age of Onset   Anxiety disorder Mother    Hyperlipidemia Mother    Diabetes Mother    Heart disease Father    Hyperlipidemia Father    Diabetes Father     Social History Reviewed with no changes to be made today.   Outpatient Medications Prior to Visit  Medication Sig Dispense Refill   atorvastatin  (LIPITOR) 20 MG tablet TAKE 1 TABLET BY MOUTH EVERY DAY 90 tablet 3   insulin  isophane & regular human KwikPen (NOVOLIN  70/30 KWIKPEN) (70-30) 100 UNIT/ML KwikPen INJECT 20 UNITS INTO THE SKIN IN THE MORNING AND AT BEDTIME. 15 mL 2   Insulin  Pen Needle 31G X 5 MM MISC Use as instructed. Inject into the skin once daily 100  each 6   lisinopril  (ZESTRIL ) 5 MG tablet Take 1 tablet (5 mg total) by mouth daily. 90 tablet 1   Semaglutide ,0.25 or 0.5MG /DOS, (OZEMPIC , 0.25 OR 0.5 MG/DOSE,) 2 MG/3ML SOPN Inject 0.5 mg into the skin once a week. For 4 weeks. Then, increase to the 1 mg dose. 3 mL 0   sertraline  (ZOLOFT ) 25 MG tablet Take 1 tablet (25 mg total) by mouth daily. 90 tablet 3   albuterol  (VENTOLIN  HFA) 108 (90 Base) MCG/ACT inhaler Inhale 1-2 puffs into the lungs every 6 (six) hours as needed. (Patient not taking: Reported on  02/02/2024) 8 g 0   clotrimazole  (LOTRIMIN ) 1 % cream APPLY TO AFFECTED AREA TWICE A DAY 60 g 0   Continuous Glucose Receiver (DEXCOM G7 RECEIVER) DEVI Use to check blood glucose continuously.  E11.65 (Patient not taking: Reported on 02/02/2024) 1 each 0   Continuous Glucose Sensor (DEXCOM G7 SENSOR) MISC Use to check blood glucose throughout the day. Changes sensors once every 10 days. E11.65 (Patient not taking: Reported on 02/02/2024) 3 each 6   empagliflozin  (JARDIANCE ) 10 MG TABS tablet Take 1 tablet (10 mg total) by mouth daily before breakfast. 90 tablet 1   fluticasone  (FLOVENT  HFA) 44 MCG/ACT inhaler Inhale 2 puffs into the lungs daily as needed (shortness of breath). (Patient not taking: Reported on 02/02/2024) 1 each 1   fluticasone -salmeterol (ADVAIR) 100-50 MCG/ACT AEPB Inhale 1 puff into the lungs 2 (two) times daily. (Patient not taking: Reported on 02/02/2024) 1 each 3   Semaglutide , 1 MG/DOSE, 4 MG/3ML SOPN Inject 1 mg as directed once a week. (Patient not taking: Reported on 02/02/2024) 3 mL 6   sildenafil  (VIAGRA ) 100 MG tablet TAKE 0.5-1 TABLETS (50-100 MG TOTAL) BY MOUTH DAILY AS NEEDED FOR ERECTILE DYSFUNCTION. (Patient not taking: Reported on 02/02/2024) 10 tablet 11   No facility-administered medications prior to visit.    Allergies[1]     Objective:    BP 118/83 (BP Location: Left Arm, Patient Position: Sitting, Cuff Size: Normal)   Pulse (!) 111   Ht 5' 10 (1.778 m)   Wt 291 lb 12.8 oz (132.4 kg)   SpO2 100%   BMI 41.87 kg/m  Wt Readings from Last 3 Encounters:  02/02/24 291 lb 12.8 oz (132.4 kg)  07/21/23 286 lb (129.7 kg)  04/21/23 294 lb 6.4 oz (133.5 kg)    Physical Exam Vitals and nursing note reviewed.  Constitutional:      Appearance: He is well-developed.  HENT:     Head: Normocephalic and atraumatic.  Cardiovascular:     Rate and Rhythm: Normal rate and regular rhythm.     Heart sounds: Normal heart sounds. No murmur heard.    No friction rub. No gallop.   Pulmonary:     Effort: Pulmonary effort is normal. No tachypnea or respiratory distress.     Breath sounds: Normal breath sounds. No decreased breath sounds, wheezing, rhonchi or rales.  Chest:     Chest wall: No tenderness.  Musculoskeletal:        General: Normal range of motion.     Cervical back: Normal range of motion.  Skin:    General: Skin is warm and dry.  Neurological:     Mental Status: He is alert and oriented to person, place, and time.     Coordination: Coordination normal.  Psychiatric:        Behavior: Behavior normal. Behavior is cooperative.        Thought Content: Thought  content normal.        Judgment: Judgment normal.          Patient has been counseled extensively about nutrition and exercise as well as the importance of adherence with medications and regular follow-up. The patient was given clear instructions to go to ER or return to medical center if symptoms don't improve, worsen or new problems develop. The patient verbalized understanding.   Follow-up: Return in about 6 months (around 08/01/2024).   Haze LELON Servant, FNP-BC [facility] [place], [place] [phone]   02/02/2024, 8:19 PM     [1] No Known Allergies  "

## 2024-02-02 NOTE — Telephone Encounter (Signed)
Sending as a Financial planner

## 2024-02-02 NOTE — Telephone Encounter (Signed)
 Copied from CRM #[id number]. Topic: General - Running Late >> Feb 02, 2024  3:57 PM Victoria B wrote:  Patient called in, running late, will be in by 4:20

## 2024-02-03 LAB — CBC WITH DIFFERENTIAL/PLATELET
Basophils Absolute: 0.1 x10E3/uL (ref 0.0–0.2)
Basos: 0 %
EOS (ABSOLUTE): 0.1 x10E3/uL (ref 0.0–0.4)
Eos: 1 %
Hematocrit: 44.9 % (ref 37.5–51.0)
Hemoglobin: 15 g/dL (ref 13.0–17.7)
Immature Grans (Abs): 0 x10E3/uL (ref 0.0–0.1)
Immature Granulocytes: 0 %
Lymphocytes Absolute: 2.7 x10E3/uL (ref 0.7–3.1)
Lymphs: 24 %
MCH: 30.7 pg (ref 26.6–33.0)
MCHC: 33.4 g/dL (ref 31.5–35.7)
MCV: 92 fL (ref 79–97)
Monocytes Absolute: 1 x10E3/uL — ABNORMAL HIGH (ref 0.1–0.9)
Monocytes: 8 %
Neutrophils Absolute: 7.8 x10E3/uL — ABNORMAL HIGH (ref 1.4–7.0)
Neutrophils: 67 %
Platelets: 362 x10E3/uL (ref 150–450)
RBC: 4.89 x10E6/uL (ref 4.14–5.80)
RDW: 11.3 % — ABNORMAL LOW (ref 11.6–15.4)
WBC: 11.6 x10E3/uL — ABNORMAL HIGH (ref 3.4–10.8)

## 2024-02-03 LAB — CMP14+EGFR
ALT: 34 IU/L (ref 0–44)
AST: 26 IU/L (ref 0–40)
Albumin: 5 g/dL (ref 4.1–5.1)
Alkaline Phosphatase: 103 IU/L (ref 47–123)
BUN/Creatinine Ratio: 17 (ref 9–20)
BUN: 20 mg/dL (ref 6–20)
Bilirubin Total: 1 mg/dL (ref 0.0–1.2)
CO2: 20 mmol/L (ref 20–29)
Calcium: 10.7 mg/dL — ABNORMAL HIGH (ref 8.7–10.2)
Chloride: 101 mmol/L (ref 96–106)
Creatinine, Ser: 1.17 mg/dL (ref 0.76–1.27)
Globulin, Total: 3 g/dL (ref 1.5–4.5)
Glucose: 225 mg/dL — ABNORMAL HIGH (ref 70–99)
Potassium: 4.3 mmol/L (ref 3.5–5.2)
Sodium: 139 mmol/L (ref 134–144)
Total Protein: 8 g/dL (ref 6.0–8.5)
eGFR: 82 mL/min/1.73

## 2024-02-07 ENCOUNTER — Ambulatory Visit: Payer: Self-pay | Admitting: Nurse Practitioner

## 2024-03-14 ENCOUNTER — Ambulatory Visit: Admitting: Pharmacist

## 2024-08-01 ENCOUNTER — Ambulatory Visit: Payer: Self-pay | Admitting: Nurse Practitioner
